# Patient Record
Sex: Female | Born: 2000 | Race: White | Hispanic: No | Marital: Single | Smoking: Former smoker
Health system: Southern US, Community
[De-identification: ages and names within clinical notes are randomized; demographics above are authoritative.]

## PROBLEM LIST (undated history)

## (undated) DIAGNOSIS — E119 Type 2 diabetes mellitus without complications: Secondary | ICD-10-CM

## (undated) DIAGNOSIS — K760 Fatty (change of) liver, not elsewhere classified: Secondary | ICD-10-CM

## (undated) DIAGNOSIS — E282 Polycystic ovarian syndrome: Secondary | ICD-10-CM

## (undated) DIAGNOSIS — J45909 Unspecified asthma, uncomplicated: Secondary | ICD-10-CM

## (undated) DIAGNOSIS — I10 Essential (primary) hypertension: Secondary | ICD-10-CM

## (undated) HISTORY — PX: WISDOM TOOTH EXTRACTION: SHX21

---
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
## 2019-02-19 ENCOUNTER — Other Ambulatory Visit: Payer: Self-pay

## 2019-02-19 ENCOUNTER — Emergency Department (HOSPITAL_COMMUNITY)
Admission: EM | Admit: 2019-02-19 | Discharge: 2019-02-19 | Disposition: A | Discharge status: Home / Self Care | Payer: Medicaid Other | Attending: Emergency Medicine | Admitting: Emergency Medicine

## 2019-02-19 ENCOUNTER — Encounter (HOSPITAL_COMMUNITY): Payer: Self-pay | Admitting: Emergency Medicine

## 2019-02-19 ENCOUNTER — Emergency Department (HOSPITAL_COMMUNITY): Payer: Medicaid Other

## 2019-02-19 DIAGNOSIS — F17220 Nicotine dependence, chewing tobacco, uncomplicated: Secondary | ICD-10-CM | POA: Insufficient documentation

## 2019-02-19 DIAGNOSIS — R109 Unspecified abdominal pain: Secondary | ICD-10-CM | POA: Diagnosis not present

## 2019-02-19 DIAGNOSIS — J45909 Unspecified asthma, uncomplicated: Secondary | ICD-10-CM | POA: Insufficient documentation

## 2019-02-19 DIAGNOSIS — N3090 Cystitis, unspecified without hematuria: Secondary | ICD-10-CM

## 2019-02-19 LAB — URINALYSIS, ROUTINE W REFLEX MICROSCOPIC
Bilirubin Urine: NEGATIVE
Glucose, UA: NEGATIVE mg/dL
Ketones, ur: 20 mg/dL — AB
Nitrite: NEGATIVE
Protein, ur: NEGATIVE mg/dL
Specific Gravity, Urine: 1.013 (ref 1.005–1.030)
pH: 7 (ref 5.0–8.0)

## 2019-02-19 LAB — PREGNANCY, URINE: Preg Test, Ur: NEGATIVE

## 2019-02-19 MED ORDER — CEPHALEXIN 500 MG PO CAPS: 500.0000 mg | ORAL_CAPSULE | Freq: Four times a day (QID) | ORAL | 0 refills | Status: DC

## 2019-02-19 NOTE — ED Triage Notes (Signed)
Pt states she has had right side flank pain for a couple days now, taking ibuprofen for pain without any relief. Pt reports hx of polycystic ovarian, fatty liver, and previous kidney infections. Pt doesn't think she's had a fever.

## 2019-02-19 NOTE — ED Provider Notes (Signed)
[facility] EMERGENCY DEPARTMENT Provider Note   CSN: [id number] Arrival date & time: 02/19/19  1308     History   Chief Complaint Chief Complaint  Patient presents with   Flank Pain    HPI Katelyn Adkins is a 18 y.o. female.     HPI  Pt was seen at 1525. Per pt, c/o gradual onset and persistence of constant right sided low back "pain" for the past several days. Pain worsens with palpation of the area and body position changes. Denies hematuria/dysuria, no abd pain, no N/V/D, no fevers, no CP/SOB, no cough, no incont/retention of bowel or bladder, no saddle anesthesia, no focal motor weakness, no tingling/numbness in extremities, no injury.   The symptoms have been associated with no other complaints.     Past Medical History:  Diagnosis Date   Asthma    Fatty liver    Polycystic ovarian disease     There are no active problems to display for this patient.   Past Surgical History:  Procedure Laterality Date   WISDOM TOOTH EXTRACTION       OB History   No obstetric history on file.      Home Medications    Prior to Admission medications   Not on File    Family History No family history on file.  Social History Social History   Tobacco Use   Smoking status: Former Smoker   Smokeless tobacco: Current User    Types: Chew  Substance Use Topics   Alcohol use: No   Drug use: No     Allergies   Tylenol [acetaminophen]   Review of Systems Review of Systems ROS: Statement: All systems negative except as marked or noted in the HPI; Constitutional: Negative for fever and chills. ; ; Eyes: Negative for eye pain, redness and discharge. ; ; ENMT: Negative for ear pain, hoarseness, nasal congestion, sinus pressure and sore throat. ; ; Cardiovascular: Negative for chest pain, palpitations, diaphoresis, dyspnea and peripheral edema. ; ; Respiratory: Negative for cough, wheezing and stridor. ; ; Gastrointestinal: Negative for nausea, vomiting,  diarrhea, abdominal pain, blood in stool, hematemesis, jaundice and rectal bleeding. . ; ; Genitourinary: Negative for dysuria and hematuria. ; ; Musculoskeletal: +LBP. Negative for neck pain. Negative for swelling and trauma.; ; GYN:  No pelvic pain, no vaginal bleeding, no vaginal discharge, no vulvar pain. ;;  Skin: Negative for pruritus, rash, abrasions, blisters, bruising and skin lesion.; ; Neuro: Negative for headache, lightheadedness and neck stiffness. Negative for weakness, altered level of consciousness, altered mental status, extremity weakness, paresthesias, involuntary movement, seizure and syncope.       Physical Exam Updated Vital Signs BP (!) 152/98 (BP Location: Right Arm)    Pulse 79    Temp 98.1 F (36.7 C) (Oral)    Resp 16    Ht 5\' 5"  (1.651 m)    LMP  (LMP Unknown)    SpO2 99%    BMI 56.58 kg/m   Physical Exam 1530: Physical examination:  Nursing notes reviewed; Vital signs and O2 SAT reviewed;  Constitutional: Well developed, Well nourished, Well hydrated, In no acute distress; Head:  Normocephalic, atraumatic; Eyes: EOMI, PERRL, No scleral icterus; ENMT: Mouth and pharynx normal, Mucous membranes moist; Neck: Supple, Full range of motion, No lymphadenopathy; Cardiovascular: Regular rate and rhythm, No gallop; Respiratory: Breath sounds clear & equal bilaterally, No wheezes.  Speaking full sentences with ease, Normal respiratory effort/excursion; Chest: Nontender, Movement normal; Abdomen: Soft, Nontender, Nondistended, Normal bowel  sounds; Genitourinary: No CVA tenderness; Spine:  No midline CS, TS, LS tenderness. +TTP right lumbar paraspinal muscles.;; Extremities: Peripheral pulses normal, No tenderness, No edema, No calf edema or asymmetry.; Neuro: AA&Ox3, Major CN grossly intact.  Speech clear. No gross focal motor or sensory deficits in extremities.; Skin: Color normal, Warm, Dry.   ED Treatments / Results  Labs (all labs ordered are listed, but only abnormal results  are displayed)   EKG None  Radiology   Procedures Procedures (including critical care time)  Medications Ordered in ED Medications - No data to display   Initial Impression / Assessment and Plan / ED Course  I have reviewed the triage vital signs and the nursing notes.  Pertinent labs & imaging results that were available during my care of the patient were reviewed by me and considered in my medical decision making (see chart for details).     MDM Reviewed: previous chart, nursing note and vitals Reviewed previous: labs Interpretation: labs and CT scan    Results for orders placed or performed during the hospital encounter of 02/19/19  Urinalysis, Routine w reflex microscopic  Result Value Ref Range   Color, Urine YELLOW YELLOW   APPearance HAZY (A) CLEAR   Specific Gravity, Urine 1.013 1.005 - 1.030   pH 7.0 5.0 - 8.0   Glucose, UA NEGATIVE NEGATIVE mg/dL   Hgb urine dipstick SMALL (A) NEGATIVE   Bilirubin Urine NEGATIVE NEGATIVE   Ketones, ur 20 (A) NEGATIVE mg/dL   Protein, ur NEGATIVE NEGATIVE mg/dL   Nitrite NEGATIVE NEGATIVE   Leukocytes,Ua MODERATE (A) NEGATIVE   RBC / HPF 0-5 0 - 5 RBC/hpf   WBC, UA 6-10 0 - 5 WBC/hpf   Bacteria, UA RARE (A) NONE SEEN   Squamous Epithelial / LPF 6-10 0 - 5  Pregnancy, urine  Result Value Ref Range   Preg Test, Ur NEGATIVE NEGATIVE   Ct Renal Stone Study Result Date: 02/19/2019 CLINICAL DATA:  Right flank pain. History kidney infections. EXAM: CT ABDOMEN AND PELVIS WITHOUT CONTRAST TECHNIQUE: Multidetector CT imaging of the abdomen and pelvis was performed following the standard protocol without IV contrast. COMPARISON:  02/05/2017 FINDINGS: Lower chest: Clear lung bases. Normal heart size without pericardial or pleural effusion. Hepatobiliary: Hepatic steatosis and hepatomegaly. 22.2 cm. Normal gallbladder, without biliary ductal dilatation. Pancreas: Normal, without mass or ductal dilatation. Spleen: Normal in size,  without focal abnormality. Adrenals/Urinary Tract: Normal adrenal glands. No renal calculi or hydronephrosis. No hydroureter or ureteric calculi. No bladder calculi. Stomach/Bowel: Normal stomach, without wall thickening. Colonic stool burden suggests constipation. Appendix not visualized. No right lower quadrant inflammation. Normal small bowel. Vascular/Lymphatic: Normal caliber of the aorta and branch vessels. No abdominal adenopathy. Prominent bilateral inguinal nodes are likely reactive. Reproductive: Normal uterus and adnexa. Other: No significant free fluid. Musculoskeletal: No acute osseous abnormality. Mild degradation secondary to patient body habitus. IMPRESSION: 1. No urinary tract calculi or hydronephrosis. 2. Possible constipation. 3. Hepatic steatosis and hepatomegaly. Electronically Signed   By: Abigail Miyamoto M.D.   On: 02/19/2019 17:04    Katelyn Adkins was evaluated in Emergency Department on 02/19/2019 for the symptoms described in the history of present illness. She was evaluated in the context of the global COVID-19 pandemic, which necessitated consideration that the patient might be at risk for infection with the SARS-CoV-2 virus that causes COVID-19. Institutional protocols and algorithms that pertain to the evaluation of patients at risk for COVID-19 are in a state of rapid change based  on information released by regulatory bodies including the CDC and federal and state organizations. These policies and algorithms were followed during the patient's care in the ED.   1725:  Workup reassuring, no clear UTI on Udip. Pt however now states she "might have a little burning" when she urinates; will tx with abx. Dx and testing, as well as incidental finding(s), d/w pt and family.  Questions answered.  Verb understanding, agreeable to d/c home with outpt f/u.    Final Clinical Impressions(s) / ED Diagnoses   Final diagnoses:  None    ED Discharge Orders    None       Samuel Jester, DO 02/23/19 2322

## 2019-02-19 NOTE — Discharge Instructions (Signed)
Take the prescription as directed.  Apply moist heat or ice to the area(s) of discomfort, for 15 minutes at a time, several times per day for the next few days.  Do not fall asleep on a heating or ice pack.  Call your regular medical doctor on Monday to schedule a follow up appointment this week.  Return to the Emergency Department immediately if worsening. ° °

## 2019-03-08 ENCOUNTER — Other Ambulatory Visit: Payer: Self-pay | Admitting: Orthopedic Surgery

## 2019-03-08 ENCOUNTER — Other Ambulatory Visit: Payer: Self-pay

## 2019-03-08 ENCOUNTER — Ambulatory Visit
Admission: RE | Admit: 2019-03-08 | Discharge: 2019-03-08 | Disposition: A | Discharge status: Home / Self Care | Payer: Medicaid Other | Source: Ambulatory Visit | Attending: Orthopedic Surgery | Admitting: Orthopedic Surgery

## 2019-03-08 DIAGNOSIS — S5001XA Contusion of right elbow, initial encounter: Secondary | ICD-10-CM

## 2019-03-08 DIAGNOSIS — S5011XA Contusion of right forearm, initial encounter: Secondary | ICD-10-CM

## 2019-05-17 ENCOUNTER — Other Ambulatory Visit: Payer: Medicaid Other

## 2019-12-17 ENCOUNTER — Other Ambulatory Visit: Payer: Self-pay

## 2019-12-17 ENCOUNTER — Encounter: Payer: Self-pay | Admitting: Emergency Medicine

## 2019-12-17 ENCOUNTER — Ambulatory Visit
Admission: EM | Admit: 2019-12-17 | Discharge: 2019-12-17 | Disposition: A | Discharge status: Home / Self Care | Payer: Medicaid Other | Attending: Emergency Medicine | Admitting: Emergency Medicine

## 2019-12-17 DIAGNOSIS — S40861A Insect bite (nonvenomous) of right upper arm, initial encounter: Secondary | ICD-10-CM

## 2019-12-17 DIAGNOSIS — W57XXXA Bitten or stung by nonvenomous insect and other nonvenomous arthropods, initial encounter: Secondary | ICD-10-CM

## 2019-12-17 DIAGNOSIS — L299 Pruritus, unspecified: Secondary | ICD-10-CM

## 2019-12-17 MED ORDER — PREDNISONE 20 MG PO TABS: 20.0000 mg | ORAL_TABLET | Freq: Two times a day (BID) | ORAL | 0 refills | Status: AC

## 2019-12-17 MED ORDER — HYDROXYZINE HCL 25 MG PO TABS: 25.0000 mg | ORAL_TABLET | Freq: Four times a day (QID) | ORAL | 0 refills | Status: DC

## 2019-12-17 NOTE — ED Triage Notes (Addendum)
Pt reports itching all over body since last night. Pt does have some red bumps on her arms, legs and under chin.  Pt states she has been itching in places that dont have the bumps as well.  Took benadryl at 0500 today and states it did help some with the itching

## 2019-12-17 NOTE — Discharge Instructions (Signed)
Wash with warm water and mild soap Prednisone prescribed.  Take as directed and to completion Use OTC zyrtec, allegra, or claritin during the day.  Benadryl at night. You may also use OTC hydrocortisone cream and/or calamine lotion to help alleviate itching Hydroxyzine for severe itching.  This may make you drowsy.  Do not take prior to driving Follow up with PCP if symptoms persists  Return or go to the ED if you have any new or worsening symptoms such as fever, chills, nausea, vomiting, difficulty breathing, throat swelling, tongue swelling, numbness/ tingling in mouth, worsening symptoms despite treatment, etc..Marland Kitchen

## 2019-12-17 NOTE — ED Provider Notes (Addendum)
[facility] CARE CENTER   [id number] 12/17/19 Arrival Time: 5366  CC: Insect bites and itching  SUBJECTIVE:  Katelyn Adkins is a 19 y.o. female who presents with insect bites and itching x 1-2 days.  Symptoms began after fishing, speculates she may have had a bug bite her RT arm and underneath her chin.  Denies changes in soaps, detergents, close contacts with similar rash, known trigger or environmental trigger, allergy. Denies medications change or starting a new medication recently.  Localizes the bites to RT arm and underneath chin.  Describes it as itchy and red.  Has tried benadryl without relief.  Symptoms are made worse with scratching.  Denies similar symptoms in the past.   Denies fever, chills, nausea, vomiting, discharge, oral manifestations, SOB, chest pain, abdominal pain, changes in bowel or bladder function.    ROS: As per HPI.  All other pertinent ROS negative.     Past Medical History:  Diagnosis Date  . Asthma   . Fatty liver   . Polycystic ovarian disease    Past Surgical History:  Procedure Laterality Date  . WISDOM TOOTH EXTRACTION     Allergies  Allergen Reactions  . Penicillins     Not sure pt report she was allergic when she was little   . Tylenol [Acetaminophen] Nausea And Vomiting   No current facility-administered medications on file prior to encounter.   No current outpatient medications on file prior to encounter.   Social History   Socioeconomic History  . Marital status: Single    Spouse name: Not on file  . Number of children: Not on file  . Years of education: Not on file  . Highest education level: Not on file  Occupational History  . Not on file  Tobacco Use  . Smoking status: Former Games developer  . Smokeless tobacco: Current User    Types: Chew  Substance and Sexual Activity  . Alcohol use: No  . Drug use: No  . Sexual activity: Not on file  Other Topics Concern  . Not on file  Social History Narrative  . Not on file   Social  Determinants of Health   Financial Resource Strain:   . Difficulty of Paying Living Expenses:   Food Insecurity:   . Worried About Programme researcher, broadcasting/film/video in the Last Year:   . Barista in the Last Year:   Transportation Needs:   . Freight forwarder (Medical):   Marland Kitchen Lack of Transportation (Non-Medical):   Physical Activity:   . Days of Exercise per Week:   . Minutes of Exercise per Session:   Stress:   . Feeling of Stress :   Social Connections:   . Frequency of Communication with Friends and Family:   . Frequency of Social Gatherings with Friends and Family:   . Attends Religious Services:   . Active Member of Clubs or Organizations:   . Attends Banker Meetings:   Marland Kitchen Marital Status:   Intimate Partner Violence:   . Fear of Current or Ex-Partner:   . Emotionally Abused:   Marland Kitchen Physically Abused:   . Sexually Abused:    No family history on file.  OBJECTIVE: Vitals:   12/17/19 0904 12/17/19 0907  BP:  (!) 152/95  Pulse:  94  Resp:  20  Temp:  98.2 F (36.8 C)  TempSrc:  Oral  SpO2:  93%  Weight: (!) 425 lb (192.8 kg)   Height: 5\' 6"  (1.676 m)  General appearance: alert; no distress Head: NCAT Lungs: normal respiratory effort Extremities: no edema Skin: warm and dry; erythematous papules with scab formation to RT arm and underneath chin, no surrounding erythema, drainage or bleeding; NTTP Psychological: alert and cooperative; normal mood and affect  ASSESSMENT & PLAN:  1. Insect bite of right upper extremity, initial encounter   2. Itching     Meds ordered this encounter  Medications  . hydrOXYzine (ATARAX/VISTARIL) 25 MG tablet    Sig: Take 1 tablet (25 mg total) by mouth every 6 (six) hours.    Dispense:  12 tablet    Refill:  0    Order Specific Question:   Supervising Provider    Answer:   Eustace Moore [[id number]]  . predniSONE (DELTASONE) 20 MG tablet    Sig: Take 1 tablet (20 mg total) by mouth 2 (two) times daily with a meal  for 5 days.    Dispense:  10 tablet    Refill:  0    Order Specific Question:   Supervising Provider    Answer:   Eustace Moore [[id number]]   Wash with warm water and mild soap Prednisone prescribed.  Take as directed and to completion Use OTC zyrtec, allegra, or claritin during the day.  Benadryl at night. You may also use OTC hydrocortisone cream and/or calamine lotion to help alleviate itching Hydroxyzine for severe itching.  This may make you drowsy.  Do not take prior to driving Follow up with PCP if symptoms persists  Return or go to the ED if you have any new or worsening symptoms such as fever, chills, nausea, vomiting, difficulty breathing, throat swelling, tongue swelling, numbness/ tingling in mouth, worsening symptoms despite treatment, etc...   Reviewed expectations re: course of current medical issues. Questions answered. Outlined signs and symptoms indicating need for more acute intervention. Patient verbalized understanding. After Visit Summary given.   Rennis Harding, PA-C 12/17/19 0865    Rennis Harding, PA-C 12/17/19 [phone]

## 2020-01-27 ENCOUNTER — Emergency Department: Payer: Medicaid Other

## 2020-01-27 ENCOUNTER — Emergency Department
Admission: EM | Admit: 2020-01-27 | Discharge: 2020-01-27 | Disposition: A | Discharge status: Home / Self Care | Payer: Medicaid Other | Attending: Emergency Medicine | Admitting: Emergency Medicine

## 2020-01-27 ENCOUNTER — Encounter: Payer: Self-pay | Admitting: Emergency Medicine

## 2020-01-27 ENCOUNTER — Other Ambulatory Visit: Payer: Self-pay

## 2020-01-27 DIAGNOSIS — R079 Chest pain, unspecified: Secondary | ICD-10-CM | POA: Diagnosis present

## 2020-01-27 DIAGNOSIS — Z5321 Procedure and treatment not carried out due to patient leaving prior to being seen by health care provider: Secondary | ICD-10-CM | POA: Diagnosis not present

## 2020-01-27 DIAGNOSIS — J45909 Unspecified asthma, uncomplicated: Secondary | ICD-10-CM | POA: Insufficient documentation

## 2020-01-27 LAB — CBC
HCT: 39.9 % (ref 36.0–46.0)
Hemoglobin: 12.6 g/dL (ref 12.0–15.0)
MCH: 26.9 pg (ref 26.0–34.0)
MCHC: 31.6 g/dL (ref 30.0–36.0)
MCV: 85.1 fL (ref 80.0–100.0)
Platelets: 324 10*3/uL (ref 150–400)
RBC: 4.69 MIL/uL (ref 3.87–5.11)
RDW: 14.1 % (ref 11.5–15.5)
WBC: 13.2 10*3/uL — ABNORMAL HIGH (ref 4.0–10.5)
nRBC: 0 % (ref 0.0–0.2)

## 2020-01-27 LAB — URINALYSIS, COMPLETE (UACMP) WITH MICROSCOPIC
Bilirubin Urine: NEGATIVE
Glucose, UA: NEGATIVE mg/dL
Hgb urine dipstick: NEGATIVE
Ketones, ur: NEGATIVE mg/dL
Nitrite: NEGATIVE
Protein, ur: 30 mg/dL — AB
Specific Gravity, Urine: 1.03 (ref 1.005–1.030)
WBC, UA: >50 WBC/hpf — ABNORMAL HIGH (ref 0–5)
pH: 5 (ref 5.0–8.0)

## 2020-01-27 LAB — BASIC METABOLIC PANEL
Anion gap: 12 (ref 5–15)
BUN: 10 mg/dL (ref 6–20)
CO2: 26 mmol/L (ref 22–32)
Calcium: 9.1 mg/dL (ref 8.9–10.3)
Chloride: 101 mmol/L (ref 98–111)
Creatinine, Ser: 0.61 mg/dL (ref 0.44–1.00)
GFR calc Af Amer: >60 mL/min (ref >60)
GFR calc non Af Amer: >60 mL/min (ref >60)
Glucose, Bld: 131 mg/dL — ABNORMAL HIGH (ref 70–99)
Potassium: 3.5 mmol/L (ref 3.5–5.1)
Sodium: 139 mmol/L (ref 135–145)

## 2020-01-27 LAB — TROPONIN I (HIGH SENSITIVITY): Troponin I (High Sensitivity): 4 ng/L (ref <18)

## 2020-01-27 LAB — POCT PREGNANCY, URINE: Preg Test, Ur: NEGATIVE

## 2020-01-27 NOTE — ED Notes (Signed)
Patient up to stat desk to report she was leaving and going to urgent care in the morning. Patient encouraged to stay. Patient left.

## 2020-01-27 NOTE — ED Triage Notes (Signed)
Pt arrives ambulatory to triage with c/o chest pain. Pt states that she has had some asthma attacks for the last two days with associated chest pain. Pt's lung sounds are clear throughout all fields at this time in triage and pt is in NAD.

## 2020-01-28 ENCOUNTER — Ambulatory Visit
Admission: EM | Admit: 2020-01-28 | Discharge: 2020-01-28 | Disposition: A | Discharge status: Home / Self Care | Payer: Medicaid Other | Attending: Emergency Medicine | Admitting: Emergency Medicine

## 2020-01-28 ENCOUNTER — Other Ambulatory Visit: Payer: Self-pay

## 2020-01-28 DIAGNOSIS — R300 Dysuria: Secondary | ICD-10-CM

## 2020-01-28 DIAGNOSIS — J4521 Mild intermittent asthma with (acute) exacerbation: Secondary | ICD-10-CM | POA: Diagnosis not present

## 2020-01-28 MED ORDER — PREDNISONE 10 MG (21) PO TBPK: ORAL_TABLET | Freq: Every day | ORAL | 0 refills | Status: DC

## 2020-01-28 MED ORDER — NITROFURANTOIN MONOHYD MACRO 100 MG PO CAPS: 100.0000 mg | ORAL_CAPSULE | Freq: Two times a day (BID) | ORAL | 0 refills | Status: DC

## 2020-01-28 NOTE — ED Provider Notes (Signed)
[facility]   [id number] 01/28/20 Arrival Time: 1317   CC: ASTHMA; UTI symptoms  SUBJECTIVE: History from: patient.  SWETHA RAYLE is a 19 y.o. female who presents with complaint of intermittent chest pain, chest tightness and dyspnea. Triggers: None. Onset gradual, approximately 3 days ago. Describes wheezing as mild when present. Fever: no. Overall normal PO intake without n/v. Sick contacts: no.  Typically her asthma is well controlled. Denies fever, chills, vomiting, SOB, chest pain, abdominal pain, changes in bowel or bladder function.   Inhaler use: Tried with intermittent relief  Also reports urinary frequency, urgency, dysuria x 2 weeks.  Reports hx of PCOS and has recurrent UTIs.  Has tried AZO.  Worse with urination.  Complains of associated nausea.  Denies fever, vomiting, abdominal or back pain.    ROS: As per HPI.  All other pertinent ROS negative.    Past Medical History:  Diagnosis Date  . Asthma   . Fatty liver   . Polycystic ovarian disease    Past Surgical History:  Procedure Laterality Date  . WISDOM TOOTH EXTRACTION     Allergies  Allergen Reactions  . Penicillins     Not sure pt report she was allergic when she was little   . Tylenol [Acetaminophen] Nausea And Vomiting   No current facility-administered medications on file prior to encounter.   No current outpatient medications on file prior to encounter.   Social History   Socioeconomic History  . Marital status: Single    Spouse name: Not on file  . Number of children: Not on file  . Years of education: Not on file  . Highest education level: Not on file  Occupational History  . Not on file  Tobacco Use  . Smoking status: Former Games developer  . Smokeless tobacco: Current User    Types: Chew  Substance and Sexual Activity  . Alcohol use: No  . Drug use: No  . Sexual activity: Not on file  Other Topics Concern  . Not on file  Social History Narrative  . Not on file   Social  Determinants of Health   Financial Resource Strain:   . Difficulty of Paying Living Expenses: Not on file  Food Insecurity:   . Worried About Programme researcher, broadcasting/film/video in the Last Year: Not on file  . Ran Out of Food in the Last Year: Not on file  Transportation Needs:   . Lack of Transportation (Medical): Not on file  . Lack of Transportation (Non-Medical): Not on file  Physical Activity:   . Days of Exercise per Week: Not on file  . Minutes of Exercise per Session: Not on file  Stress:   . Feeling of Stress : Not on file  Social Connections:   . Frequency of Communication with Friends and Family: Not on file  . Frequency of Social Gatherings with Friends and Family: Not on file  . Attends Religious Services: Not on file  . Active Member of Clubs or Organizations: Not on file  . Attends Banker Meetings: Not on file  . Marital Status: Not on file  Intimate Partner Violence:   . Fear of Current or Ex-Partner: Not on file  . Emotionally Abused: Not on file  . Physically Abused: Not on file  . Sexually Abused: Not on file   No family history on file.  OBJECTIVE:  Vitals:   01/28/20 1412  BP: (!) 155/85  Pulse: 94  Resp: 17  Temp: 97.9 F (  36.6 C)  TempSrc: Tympanic  SpO2: 98%     General appearance: alert; well-appearing, nontoxic; speaking in full sentences and tolerating own secretions HEENT: NCAT; Ears: EACs clear, TMs pearly gray; Eyes: PERRL.  EOM grossly intact.Nose: nares patent without rhinorrhea, Throat: oropharynx clear, tonsils non erythematous or enlarged, uvula midline  Neck: supple without LAD Lungs: unlabored respirations, symmetrical air entry; cough: absent; no respiratory distress; CTAB Heart: regular rate and rhythm.  Skin: warm and dry Psychological: alert and cooperative; normal mood and affect   LABS:  Results for orders placed or performed during the hospital encounter of 01/27/20 (from the past 24 hour(s))  Urinalysis, Complete w  Microscopic     Status: Abnormal   Collection Time: 01/27/20  9:40 PM  Result Value Ref Range   Color, Urine AMBER (A) YELLOW   APPearance CLOUDY (A) CLEAR   Specific Gravity, Urine 1.030 1.005 - 1.030   pH 5.0 5.0 - 8.0   Glucose, UA NEGATIVE NEGATIVE mg/dL   Hgb urine dipstick NEGATIVE NEGATIVE   Bilirubin Urine NEGATIVE NEGATIVE   Ketones, ur NEGATIVE NEGATIVE mg/dL   Protein, ur 30 (A) NEGATIVE mg/dL   Nitrite NEGATIVE NEGATIVE   Leukocytes,Ua MODERATE (A) NEGATIVE   RBC / HPF 0-5 0 - 5 RBC/hpf   WBC, UA >50 (H) 0 - 5 WBC/hpf   Bacteria, UA RARE (A) NONE SEEN   Squamous Epithelial / LPF 11-20 0 - 5   Mucus PRESENT   Pregnancy, urine POC     Status: None   Collection Time: 01/27/20 10:18 PM  Result Value Ref Range   Preg Test, Ur NEGATIVE NEGATIVE  Basic metabolic panel     Status: Abnormal   Collection Time: 01/27/20 10:26 PM  Result Value Ref Range   Sodium 139 135 - 145 mmol/L   Potassium 3.5 3.5 - 5.1 mmol/L   Chloride 101 98 - 111 mmol/L   CO2 26 22 - 32 mmol/L   Glucose, Bld 131 (H) 70 - 99 mg/dL   BUN 10 6 - 20 mg/dL   Creatinine, Ser 6.33 0.44 - 1.00 mg/dL   Calcium 9.1 8.9 - 35.4 mg/dL   GFR calc non Af Amer >60 >60 mL/min   GFR calc Af Amer >60 >60 mL/min   Anion gap 12 5 - 15  CBC     Status: Abnormal   Collection Time: 01/27/20 10:26 PM  Result Value Ref Range   WBC 13.2 (H) 4.0 - 10.5 K/uL   RBC 4.69 3.87 - 5.11 MIL/uL   Hemoglobin 12.6 12.0 - 15.0 g/dL   HCT 56.2 36 - 46 %   MCV 85.1 80.0 - 100.0 fL   MCH 26.9 26.0 - 34.0 pg   MCHC 31.6 30.0 - 36.0 g/dL   RDW 56.3 89.3 - 73.4 %   Platelets 324 150 - 400 K/uL   nRBC 0.0 0.0 - 0.2 %  Troponin I (High Sensitivity)     Status: None   Collection Time: 01/27/20 10:26 PM  Result Value Ref Range   Troponin I (High Sensitivity) 4 <18 ng/L     EKG reviewed from 01/27/20  EKG sinus tachycardia without ST elevations, depressions, or prolonged PR interval.  No narrowing or widening of the QRS complexes.   Comparable to past EKG.   DIAGNOSTIC STUDIES:  DG Chest 2 View  Result Date: 01/27/2020 CLINICAL DATA:  Chest pain 12/28/2016 EXAM: CHEST - 2 VIEW COMPARISON:  None. FINDINGS: Lungs are well expanded, symmetric, and clear.  No pneumothorax or pleural effusion. Cardiac size within normal limits. Pulmonary vascularity is normal. Osseous structures are age-appropriate. No acute bone abnormality. IMPRESSION: No active cardiopulmonary disease. Electronically Signed   By: Helyn Numbers MD   On: 01/27/2020 22:25    X-rays negative for cardiopulmonary disease  I have reviewed the x-rays myself and the radiologist interpretation. I am in agreement with the radiologist interpretation.     ASSESSMENT & PLAN:  1. Mild intermittent asthma with acute exacerbation   2. Dysuria     Meds ordered this encounter  Medications  . predniSONE (STERAPRED UNI-PAK 21 TAB) 10 MG (21) TBPK tablet    Sig: Take by mouth daily. Take 6 tabs by mouth daily  for 2 days, then 5 tabs for 2 days, then 4 tabs for 2 days, then 3 tabs for 2 days, 2 tabs for 2 days, then 1 tab by mouth daily for 2 days    Dispense:  42 tablet    Refill:  0    Order Specific Question:   Supervising Provider    Answer:   Eustace Moore [[id number]]  . nitrofurantoin, macrocrystal-monohydrate, (MACROBID) 100 MG capsule    Sig: Take 1 capsule (100 mg total) by mouth 2 (two) times daily.    Dispense:  10 capsule    Refill:  0    Order Specific Question:   Supervising Provider    Answer:   Eustace Moore [[id number]]   Reviewed work-up from ED.  Everything appeared within normal limits.  Urine did show signs of infection.    Prednisone prescribed.  Take as directed and to completion Use inhaler as needed for SOB and wheezing Follow up with PCP next week Return here or go to ER if you have any new or worsening symptoms such as shortness of breath, difficulty breathing, accessory muscle use, rib retraction, if symptoms do not improve with  medication, chest pain, chest tightness, etc...  Urine analysis concerning for UTI Push fluids and get plenty of rest.   Take antibiotic as directed and to completion Follow up with PCP if symptoms persists Return here or go to ER if you have any new or worsening symptoms such as fever, worsening abdominal pain, nausea/vomiting, flank pain, etc..  Follow up with PCP this week for recheck  Reviewed expectations re: course of current medical issues. Questions answered. Outlined signs and symptoms indicating need for more acute intervention. Patient verbalized understanding. After Visit Summary given.          Rennis Harding, PA-C 01/28/20 1429

## 2020-01-28 NOTE — ED Triage Notes (Signed)
Pt triaged by provider  

## 2020-01-28 NOTE — Discharge Instructions (Addendum)
Reviewed work-up from ED.  Everything appeared within normal limits.  Urine did show signs of infection.    Prednisone prescribed.  Take as directed and to completion Use inhaler as needed for SOB and wheezing Follow up with PCP next week Return here or go to ER if you have any new or worsening symptoms such as shortness of breath, difficulty breathing, accessory muscle use, rib retraction, if symptoms do not improve with medication, chest pain, chest tightness, etc...  Urine analysis concerning for UTI Push fluids and get plenty of rest.   Take antibiotic as directed and to completion Follow up with PCP if symptoms persists Return here or go to ER if you have any new or worsening symptoms such as fever, worsening abdominal pain, nausea/vomiting, flank pain, etc..  Follow up with PCP this week for recheck

## 2020-03-07 ENCOUNTER — Ambulatory Visit
Admission: RE | Admit: 2020-03-07 | Discharge: 2020-03-07 | Disposition: A | Discharge status: Home / Self Care | Payer: Medicaid Other | Source: Ambulatory Visit | Attending: Emergency Medicine | Admitting: Emergency Medicine

## 2020-03-07 ENCOUNTER — Encounter: Payer: Medicaid Other | Admitting: Adult Health

## 2020-03-07 ENCOUNTER — Other Ambulatory Visit: Payer: Self-pay

## 2020-03-07 VITALS — BP 141/64 | HR 89 | Temp 97.9°F | Resp 16

## 2020-03-07 DIAGNOSIS — M5442 Lumbago with sciatica, left side: Secondary | ICD-10-CM

## 2020-03-07 MED ORDER — PREDNISONE 10 MG (21) PO TBPK: ORAL_TABLET | ORAL | 0 refills | Status: DC

## 2020-03-07 MED ORDER — CYCLOBENZAPRINE HCL 10 MG PO TABS: 10.0000 mg | ORAL_TABLET | Freq: Two times a day (BID) | ORAL | 0 refills | Status: AC | PRN

## 2020-03-07 NOTE — Discharge Instructions (Addendum)
Rest, ice and heat as needed Ensure adequate ROM as tolerated. Continue to take Tylenol as needed for pain Prescribed prednisone for inflammation Prescribed flexeril  for muscle spasm.  Do not drive or operate heavy machinery while taking this medication Return here or go to ER if you have any new or worsening symptoms such as numbness/tingling of the inner thighs, loss of bladder or bowel control, headache/blurry vision, nausea/vomiting, confusion/altered mental status, dizziness, weakness, passing out, imbalance, etc..Marland Kitchen

## 2020-03-07 NOTE — ED Triage Notes (Signed)
Pt presents with c/o back pain for 3-4 days, denies injury , lower back involved

## 2020-03-07 NOTE — ED Provider Notes (Signed)
[facility]   [id number] 03/07/20 Arrival Time: 1137   Chief Complaint  Patient presents with  . Back Pain     SUBJECTIVE: History from: patient.  CHRISIE JANKOVICH is a 19 y.o. female who presented to the urgent care with a complaint of back pain for the past 4 days.  Denies any precipitating event, trauma or injury.  She localizes the pain to the left low back.  Reports pain is radiating to the left leg..  She describes the pain as constant and achy.  She has tried OTC medications without relief.  Her symptoms are made worse with ROM.  She denies similar symptoms in the past.  Denies chills, fever, nausea, vomiting, diarrhea.   ROS: As per HPI.  All other pertinent ROS negative.     Past Medical History:  Diagnosis Date  . Asthma   . Fatty liver   . Polycystic ovarian disease    Past Surgical History:  Procedure Laterality Date  . WISDOM TOOTH EXTRACTION     Allergies  Allergen Reactions  . Penicillins     Not sure pt report she was allergic when she was little   . Tylenol [Acetaminophen] Nausea And Vomiting   No current facility-administered medications on file prior to encounter.   Current Outpatient Medications on File Prior to Encounter  Medication Sig Dispense Refill  . nitrofurantoin, macrocrystal-monohydrate, (MACROBID) 100 MG capsule Take 1 capsule (100 mg total) by mouth 2 (two) times daily. 10 capsule 0   Social History   Socioeconomic History  . Marital status: Single    Spouse name: Not on file  . Number of children: Not on file  . Years of education: Not on file  . Highest education level: Not on file  Occupational History  . Not on file  Tobacco Use  . Smoking status: Former Games developer  . Smokeless tobacco: Current User    Types: Chew  Substance and Sexual Activity  . Alcohol use: No  . Drug use: No  . Sexual activity: Not on file  Other Topics Concern  . Not on file  Social History Narrative  . Not on file   Social Determinants  of Health   Financial Resource Strain:   . Difficulty of Paying Living Expenses: Not on file  Food Insecurity:   . Worried About Programme researcher, broadcasting/film/video in the Last Year: Not on file  . Ran Out of Food in the Last Year: Not on file  Transportation Needs:   . Lack of Transportation (Medical): Not on file  . Lack of Transportation (Non-Medical): Not on file  Physical Activity:   . Days of Exercise per Week: Not on file  . Minutes of Exercise per Session: Not on file  Stress:   . Feeling of Stress : Not on file  Social Connections:   . Frequency of Communication with Friends and Family: Not on file  . Frequency of Social Gatherings with Friends and Family: Not on file  . Attends Religious Services: Not on file  . Active Member of Clubs or Organizations: Not on file  . Attends Banker Meetings: Not on file  . Marital Status: Not on file  Intimate Partner Violence:   . Fear of Current or Ex-Partner: Not on file  . Emotionally Abused: Not on file  . Physically Abused: Not on file  . Sexually Abused: Not on file   History reviewed. No pertinent family history.  OBJECTIVE:  Vitals:   03/07/20 1143  BP: (!) 141/64  Pulse: 89  Resp: 16  Temp: 97.9 F (36.6 C)  SpO2: 97%     Physical Exam Vitals and nursing note reviewed.  Constitutional:      General: She is not in acute distress.    Appearance: Normal appearance. She is normal weight. She is not ill-appearing, toxic-appearing or diaphoretic.  HENT:     Head: Normocephalic.  Cardiovascular:     Rate and Rhythm: Normal rate and regular rhythm.     Pulses: Normal pulses.     Heart sounds: Normal heart sounds. No murmur heard.  No friction rub. No gallop.   Pulmonary:     Effort: Pulmonary effort is normal. No respiratory distress.     Breath sounds: Normal breath sounds. No stridor. No wheezing, rhonchi or rales.  Chest:     Chest wall: No tenderness.  Musculoskeletal:        General: Tenderness present.      Lumbar back: Swelling and tenderness present.     Comments: Back:  Patient ambulates from chair to exam table without difficulty.  Inspection: Skin clear and intact without obvious swelling, erythema, or ecchymosis. Warm to the touch  Palpation: Vertebral processes nontender. Tenderness about the lower left paravertebral muscles  ROM: FROM Strength: 5/5 hip flexion, 5/5 knee extension, 5/5 knee flexion, 5/5 plantar flexion, 5/5 dorsiflexion  DTR: Patellar tendon reflex intact    Neurological:     Mental Status: She is alert and oriented to person, place, and time.     LABS:  No results found for this or any previous visit (from the past 24 hour(s)).   ASSESSMENT & PLAN:  1. Acute left-sided low back pain with left-sided sciatica     Meds ordered this encounter  Medications  . cyclobenzaprine (FLEXERIL) 10 MG tablet    Sig: Take 1 tablet (10 mg total) by mouth 2 (two) times daily as needed for muscle spasms.    Dispense:  20 tablet    Refill:  0  . predniSONE (STERAPRED UNI-PAK 21 TAB) 10 MG (21) TBPK tablet    Sig: Take 6 tabs by mouth daily  for 1 days, then 5 tabs for 1 days, then 4 tabs for 1 days, then 3 tabs for 1 days, 2 tabs for 1 days, then 1 tab by mouth daily for 1 days    Dispense:  21 tablet    Refill:  0    Discharge instructions  Rest, ice and heat as needed Ensure adequate ROM as tolerated. Continue to take Tylenol as needed for pain Prescribed prednisone for inflammation Prescribed flexeril  for muscle spasm.  Do not drive or operate heavy machinery while taking this medication Return here or go to ER if you have any new or worsening symptoms such as numbness/tingling of the inner thighs, loss of bladder or bowel control, headache/blurry vision, nausea/vomiting, confusion/altered mental status, dizziness, weakness, passing out, imbalance, etc...     Reviewed expectations re: course of current medical issues. Questions answered. Outlined signs and  symptoms indicating need for more acute intervention. Patient verbalized understanding. After Visit Summary given.         Durward Parcel, FNP 03/07/20 1219

## 2020-03-22 ENCOUNTER — Other Ambulatory Visit: Payer: Self-pay

## 2020-03-22 ENCOUNTER — Ambulatory Visit
Admission: RE | Admit: 2020-03-22 | Discharge: 2020-03-22 | Disposition: A | Discharge status: Home / Self Care | Payer: Medicaid Other | Source: Ambulatory Visit | Attending: Emergency Medicine | Admitting: Emergency Medicine

## 2020-03-22 VITALS — BP 145/97 | HR 103 | Temp 98.9°F | Resp 19 | Ht 65.0 in | Wt >= 6400 oz

## 2020-03-22 DIAGNOSIS — Z1152 Encounter for screening for COVID-19: Secondary | ICD-10-CM | POA: Diagnosis not present

## 2020-03-22 DIAGNOSIS — A084 Viral intestinal infection, unspecified: Secondary | ICD-10-CM

## 2020-03-22 HISTORY — DX: Essential (primary) hypertension: I10

## 2020-03-22 HISTORY — DX: Type 2 diabetes mellitus without complications: E11.9

## 2020-03-22 MED ORDER — ONDANSETRON 4 MG PO TBDP: 4.0000 mg | ORAL_TABLET | Freq: Three times a day (TID) | ORAL | 0 refills | Status: DC | PRN

## 2020-03-22 NOTE — ED Provider Notes (Addendum)
[facility]   [id number] 03/22/20 Arrival Time: 1656  CC: ABDOMINAL DISCOMFORT  SUBJECTIVE:  Katelyn Adkins is a 19 y.o. female who presented to the urgent care with a complaint of nausea, vomiting, diarrhea that started for the past 2 days.  Has 2 emesis in the last 24 hours.  Denies a precipitating event, trauma, close contacts with similar symptoms, recent travel or antibiotic use.  Localizes pain to generalized abdomen.  Describes it as constant and aching in character.  Has not tried any OTC medication.  Denies alleviating or aggravating factors.  Denies similar symptoms in the past.   Denies fever, chills, appetite changes, weight changes, nausea, vomiting hematochezia, melena, dysuria, difficulty urinating, increased frequency or urgency, flank pain, loss of bowel or bladder function, vaginal discharge, vaginal odor, vaginal bleeding, dyspareunia, pelvic pain.     No LMP recorded (lmp unknown). (Menstrual status: Irregular Periods).  ROS: As per HPI.  All other pertinent ROS negative.     Past Medical History:  Diagnosis Date  . Asthma   . Diabetes mellitus without complication (HCC)   . Fatty liver   . Hypertension   . Polycystic ovarian disease    Past Surgical History:  Procedure Laterality Date  . WISDOM TOOTH EXTRACTION     Allergies  Allergen Reactions  . Penicillins     Not sure pt report she was allergic when she was little   . Tylenol [Acetaminophen] Nausea And Vomiting   No current facility-administered medications on file prior to encounter.   Current Outpatient Medications on File Prior to Encounter  Medication Sig Dispense Refill  . cyclobenzaprine (FLEXERIL) 10 MG tablet Take 1 tablet (10 mg total) by mouth 2 (two) times daily as needed for muscle spasms. 20 tablet 0  . nitrofurantoin, macrocrystal-monohydrate, (MACROBID) 100 MG capsule Take 1 capsule (100 mg total) by mouth 2 (two) times daily. 10 capsule 0  . predniSONE (STERAPRED UNI-PAK 21  TAB) 10 MG (21) TBPK tablet Take 6 tabs by mouth daily  for 1 days, then 5 tabs for 1 days, then 4 tabs for 1 days, then 3 tabs for 1 days, 2 tabs for 1 days, then 1 tab by mouth daily for 1 days 21 tablet 0   Social History   Socioeconomic History  . Marital status: Single    Spouse name: Not on file  . Number of children: Not on file  . Years of education: Not on file  . Highest education level: Not on file  Occupational History  . Not on file  Tobacco Use  . Smoking status: Former Games developer  . Smokeless tobacco: Former Neurosurgeon    Types: Chew  Substance and Sexual Activity  . Alcohol use: No  . Drug use: No  . Sexual activity: Not on file  Other Topics Concern  . Not on file  Social History Narrative  . Not on file   Social Determinants of Health   Financial Resource Strain:   . Difficulty of Paying Living Expenses: Not on file  Food Insecurity:   . Worried About Programme researcher, broadcasting/film/video in the Last Year: Not on file  . Ran Out of Food in the Last Year: Not on file  Transportation Needs:   . Lack of Transportation (Medical): Not on file  . Lack of Transportation (Non-Medical): Not on file  Physical Activity:   . Days of Exercise per Week: Not on file  . Minutes of Exercise per Session: Not on file  Stress:   .  Feeling of Stress : Not on file  Social Connections:   . Frequency of Communication with Friends and Family: Not on file  . Frequency of Social Gatherings with Friends and Family: Not on file  . Attends Religious Services: Not on file  . Active Member of Clubs or Organizations: Not on file  . Attends Banker Meetings: Not on file  . Marital Status: Not on file  Intimate Partner Violence:   . Fear of Current or Ex-Partner: Not on file  . Emotionally Abused: Not on file  . Physically Abused: Not on file  . Sexually Abused: Not on file   No family history on file.   OBJECTIVE:  Vitals:   03/22/20 1706 03/22/20 1707  BP: (!) 145/97   Pulse: (!) 103     Resp: 19   Temp: 98.9 F (37.2 C)   TempSrc: Oral   SpO2: 95%   Weight:  (!) 410 lb (186 kg)  Height:  5\' 5"  (1.651 m)    General appearance: Alert; NAD HEENT: NCAT.  Oropharynx clear.  Lungs: clear to auscultation bilaterally without adventitious breath sounds Heart: regular rate and rhythm.  Radial pulses 2+ symmetrical bilaterally Abdomen: soft, non-distended; normal active bowel sounds; non-tender to light and deep palpation; nontender at McBurney's point; negative Murphy's sign; negative rebound; no guarding Back: no CVA tenderness Extremities: no edema; symmetrical with no gross deformities Skin: warm and dry Neurologic: normal gait Psychological: alert and cooperative; normal mood and affect  LABS: No results found for this or any previous visit (from the past 24 hour(s)).  DIAGNOSTIC STUDIES: No results found.   ASSESSMENT & PLAN:  1. Encounter for screening for COVID-19   2. Viral gastroenteritis     Meds ordered this encounter  Medications  . ondansetron (ZOFRAN ODT) 4 MG disintegrating tablet    Sig: Take 1 tablet (4 mg total) by mouth every 8 (eight) hours as needed for nausea or vomiting.    Dispense:  30 tablet    Refill:  0    Discharge instructions  Get rest and drink fluids Zofran prescribed.  Take as directed.   Use OTC Imodium as needed for diarrhea  DIET Instructions:  30 minutes after taking nausea medicine, begin with sips of clear liquids. If able to hold down 2 - 4 ounces for 30 minutes, begin drinking more. Increase your fluid intake to replace losses. Clear liquids only for 24 hours (water, tea, sport drinks, clear flat ginger ale or cola and juices, broth, jello, popsicles, ect). Advance to bland foods, applesauce, rice, baked or boiled chicken, ect. Avoid milk, greasy foods and anything that doesn't agree with you.  If you experience new or worsening symptoms return or go to ER such as fever, chills, nausea, vomiting, diarrhea, bloody  or dark tarry stools, constipation, urinary symptoms, worsening abdominal discomfort, symptoms that do not improve with medications, inability to keep fluids down, etc...  Reviewed expectations re: course of current medical issues. Questions answered. Outlined signs and symptoms indicating need for more acute intervention. Patient verbalized understanding. After Visit Summary given.   , FNP 03/22/20 1722    13/11/21, FNP 03/22/20 1724

## 2020-03-22 NOTE — Discharge Instructions (Addendum)
Get rest and drink fluids Zofran prescribed.  Take as directed.   Use OTC Imodium as needed for diarrhea DIET Instructions:  30 minutes after taking nausea medicine, begin with sips of clear liquids. If able to hold down 2 - 4 ounces for 30 minutes, begin drinking more. Increase your fluid intake to replace losses. Clear liquids only for 24 hours (water, tea, sport drinks, clear flat ginger ale or cola and juices, broth, jello, popsicles, ect). Advance to bland foods, applesauce, rice, baked or boiled chicken, ect. Avoid milk, greasy foods and anything that doesn't agree with you.  If you experience new or worsening symptoms return or go to ER such as fever, chills, nausea, vomiting, diarrhea, bloody or dark tarry stools, constipation, urinary symptoms, worsening abdominal discomfort, symptoms that do not improve with medications, inability to keep fluids down, etc..Marland Kitchen

## 2020-03-22 NOTE — ED Triage Notes (Addendum)
N/V/D since Tuesday morning. abd pain in the middle of her abd that started last night.   Has vomited x 2 in 24 hours and has had diarrhea after she tries to eat something

## 2020-03-24 LAB — SARS-COV-2, NAA 2 DAY TAT

## 2020-03-24 LAB — NOVEL CORONAVIRUS, NAA: SARS-CoV-2, NAA: NOT DETECTED

## 2020-05-04 ENCOUNTER — Encounter: Payer: Self-pay | Admitting: Emergency Medicine

## 2020-05-04 ENCOUNTER — Ambulatory Visit
Admission: EM | Admit: 2020-05-04 | Discharge: 2020-05-04 | Disposition: A | Discharge status: Home / Self Care | Payer: BLUE CROSS/BLUE SHIELD | Attending: Emergency Medicine | Admitting: Emergency Medicine

## 2020-05-04 ENCOUNTER — Other Ambulatory Visit: Payer: Self-pay

## 2020-05-04 DIAGNOSIS — R062 Wheezing: Secondary | ICD-10-CM

## 2020-05-04 DIAGNOSIS — R509 Fever, unspecified: Secondary | ICD-10-CM

## 2020-05-04 DIAGNOSIS — R197 Diarrhea, unspecified: Secondary | ICD-10-CM

## 2020-05-04 MED ORDER — ONDANSETRON 4 MG PO TBDP: 4.0000 mg | ORAL_TABLET | Freq: Three times a day (TID) | ORAL | 0 refills | Status: DC | PRN

## 2020-05-04 MED ORDER — PREDNISONE 20 MG PO TABS: 40.0000 mg | ORAL_TABLET | Freq: Every day | ORAL | 0 refills | Status: AC

## 2020-05-04 NOTE — ED Triage Notes (Signed)
Patient in office diarrhea, sob x4d , chills and abdominal pain   OTC: [facility]

## 2020-05-04 NOTE — Discharge Instructions (Signed)
Use of inhaler or nebulizer as needed for wheezing or shortness of breath.   Complete 5 days of prednisone.  Zofran every 8 hours as needed for nausea or vomiting.   Use of imodium as needed for diarrhea, per box instruct.  Small frequent sips of fluids- Pedialyte, Gatorade, water, broth- to maintain hydration.   Self isolate until covid results are back, unfortunately this may be next week before they result.  Will notify you by phone of any positive findings. Your negative results will be sent through your MyChart.     Please go to the ER for any worsening of symptoms.

## 2020-05-04 NOTE — ED Provider Notes (Signed)
[facility]    CSN: [id number] Arrival date & time: 05/04/20  1040      History   Chief Complaint No chief complaint on file.   HPI Katelyn Adkins is a 19 y.o. female.   Alyson Reedy presents with complaints of chills, wheezing, shortness of breath, diarrhea, upper abdominal pain, nausea, no vomiting although did dry heave. Decreased oral intake. No congestion or nasal drainage. Rare cough. No sore throat or pain. Did take ibuprofen early this morning and just purchased over the counter diarrheal medication today. No known ill contacts. No history of covid-19 and has not received vaccination. History of asthma and dm.      ROS per HPI, negative if not otherwise mentioned.       Past Medical History:  Diagnosis Date  . Asthma   . Diabetes mellitus without complication (HCC)   . Fatty liver   . Hypertension   . Polycystic ovarian disease     There are no problems to display for this patient.   Past Surgical History:  Procedure Laterality Date  . WISDOM TOOTH EXTRACTION      OB History   No obstetric history on file.      Home Medications    Prior to Admission medications   Medication Sig Start Date End Date Taking? Authorizing Provider  cyclobenzaprine (FLEXERIL) 10 MG tablet Take 1 tablet (10 mg total) by mouth 2 (two) times daily as needed for muscle spasms. 03/07/20   Avegno, Zachery Dakins, FNP  nitrofurantoin, macrocrystal-monohydrate, (MACROBID) 100 MG capsule Take 1 capsule (100 mg total) by mouth 2 (two) times daily. 01/28/20   Wurst, [place], PA-C  ondansetron (ZOFRAN-ODT) 4 MG disintegrating tablet Take 1 tablet (4 mg total) by mouth every 8 (eight) hours as needed for nausea or vomiting. 05/04/20   Georgetta Haber, NP  predniSONE (DELTASONE) 20 MG tablet Take 2 tablets (40 mg total) by mouth daily with breakfast for 5 days. 05/04/20 05/09/20  Georgetta Haber, NP    Family History Family History  Problem Relation Age of Onset  .  Diabetes Mother   . Healthy Father     Social History Social History   Tobacco Use  . Smoking status: Former Games developer  . Smokeless tobacco: Former Neurosurgeon    Types: Chew  Substance Use Topics  . Alcohol use: No  . Drug use: No     Allergies   Penicillins and Tylenol [acetaminophen]   Review of Systems Review of Systems   Physical Exam Triage Vital Signs ED Triage Vitals  Enc Vitals Group     BP 05/04/20 1144 137/79     Pulse Rate 05/04/20 1144 (!) 109     Resp 05/04/20 1144 (!) 23     Temp 05/04/20 1144 (!) 100.5 F (38.1 C)     Temp Source 05/04/20 1144 Oral     SpO2 05/04/20 1144 97 %     Weight 05/04/20 1148 (!) 415 lb (188.2 kg)     Height 05/04/20 1148 5\' 5"  (1.651 m)     Head Circumference --      Peak Flow --      Pain Score 05/04/20 1147 5     Pain Loc --      Pain Edu? --      Excl. in GC? --    No data found.  Updated Vital Signs BP 137/79 (BP Location: Left Wrist)   Pulse (!) 109   Temp (!) 100.5 F (  38.1 C) (Oral)   Resp (!) 23   Ht 5\' 5"  (1.651 m)   Wt (!) 415 lb (188.2 kg)   LMP 12/03/2019   SpO2 97%   BMI 69.06 kg/m   Visual Acuity Right Eye Distance:   Left Eye Distance:   Bilateral Distance:    Right Eye Near:   Left Eye Near:    Bilateral Near:     Physical Exam Constitutional:      General: She is not in acute distress.    Appearance: She is well-developed.  Cardiovascular:     Rate and Rhythm: Normal rate.  Pulmonary:     Effort: Pulmonary effort is normal. Tachypnea present.     Breath sounds: Wheezing present.  Abdominal:     Tenderness: There is no abdominal tenderness.     Comments: No gross abdominal tenderness on palpation   Skin:    General: Skin is warm and dry.  Neurological:     Mental Status: She is alert and oriented to person, place, and time.      UC Treatments / Results  Labs (all labs ordered are listed, but only abnormal results are displayed) Labs Reviewed  COVID-19, FLU A+B NAA     EKG   Radiology No results found.  Procedures Procedures (including critical care time)  Medications Ordered in UC Medications - No data to display  Initial Impression / Assessment and Plan / UC Course  I have reviewed the triage vital signs and the nursing notes.  Pertinent labs & imaging results that were available during my care of the patient were reviewed by me and considered in my medical decision making (see chart for details).     Non toxic in appearance. Febrile today. Wheezing, diarrhea. Concern for covid-19 with these symptoms. Covid testing pending and isolation instructions provided.  Prednisone provided to help with wheezing. Rehydration and gi symptom management discussed. Return precautions provided. Patient verbalized understanding and agreeable to plan.   Final Clinical Impressions(s) / UC Diagnoses   Final diagnoses:  Wheezing  Febrile illness  Diarrhea, unspecified type     Discharge Instructions     Use of inhaler or nebulizer as needed for wheezing or shortness of breath.   Complete 5 days of prednisone.  Zofran every 8 hours as needed for nausea or vomiting.   Use of imodium as needed for diarrhea, per box instruct.  Small frequent sips of fluids- Pedialyte, Gatorade, water, broth- to maintain hydration.   Self isolate until covid results are back, unfortunately this may be next week before they result.  Will notify you by phone of any positive findings. Your negative results will be sent through your MyChart.     Please go to the ER for any worsening of symptoms.     ED Prescriptions    Medication Sig Dispense Auth. Provider   ondansetron (ZOFRAN-ODT) 4 MG disintegrating tablet Take 1 tablet (4 mg total) by mouth every 8 (eight) hours as needed for nausea or vomiting. 12 tablet 12/05/2019 B, NP   predniSONE (DELTASONE) 20 MG tablet Take 2 tablets (40 mg total) by mouth daily with breakfast for 5 days. 10 tablet Linus Mako, NP      PDMP not reviewed this encounter.   Georgetta Haber, NP 05/04/20 1342

## 2020-05-07 LAB — COVID-19, FLU A+B NAA
Influenza A, NAA: NOT DETECTED
Influenza B, NAA: NOT DETECTED
SARS-CoV-2, NAA: NOT DETECTED

## 2020-06-18 ENCOUNTER — Ambulatory Visit
Admission: EM | Admit: 2020-06-18 | Discharge: 2020-06-18 | Disposition: A | Discharge status: Home / Self Care | Payer: Medicaid Other | Attending: Emergency Medicine | Admitting: Emergency Medicine

## 2020-06-18 DIAGNOSIS — Z1152 Encounter for screening for COVID-19: Secondary | ICD-10-CM

## 2020-06-18 DIAGNOSIS — J4521 Mild intermittent asthma with (acute) exacerbation: Secondary | ICD-10-CM | POA: Diagnosis not present

## 2020-06-18 DIAGNOSIS — J029 Acute pharyngitis, unspecified: Secondary | ICD-10-CM

## 2020-06-18 MED ORDER — BENZONATATE 100 MG PO CAPS: 100.0000 mg | ORAL_CAPSULE | Freq: Three times a day (TID) | ORAL | 0 refills | Status: DC

## 2020-06-18 MED ORDER — PREDNISONE 10 MG (21) PO TBPK: ORAL_TABLET | Freq: Every day | ORAL | 0 refills | Status: DC

## 2020-06-18 MED ORDER — AZITHROMYCIN 250 MG PO TABS: 250.0000 mg | ORAL_TABLET | Freq: Every day | ORAL | 0 refills | Status: DC

## 2020-06-18 NOTE — ED Triage Notes (Signed)
Patient presents to Urgent Care with complaints of cough and sore throat. She states cough started 2 weeks ago and sore throat since Friday. Treating symptoms with Mucinex and cough syrup.   Denies fever, abdominal pain, N/V, or diarrhea.

## 2020-06-18 NOTE — Discharge Instructions (Addendum)
COVID testing ordered.  It will take between 2-7 days for test results.  Someone will contact you regarding abnormal results.    Get plenty of rest and push fluids Tessalon Perles prescribed for cough Continue to use albuterol as prescribed and directed Prednisone be prescribed Azithromycin was prescribed Use medications daily for symptom relief Use OTC medications like ibuprofen or tylenol as needed fever or pain Call or go to the ED if you have any new or worsening symptoms such as fever, worsening cough, shortness of breath, chest tightness, chest pain, turning blue, changes in mental status, etc..Marland Kitchen

## 2020-06-18 NOTE — ED Provider Notes (Signed)
[facility]   [id number] 06/18/20 Arrival Time: 9937   CC: COVID symptoms  SUBJECTIVE: History from: patient.  Katelyn Adkins is a 20 y.o. female with history of asthma presented to the urgent care with a complaint of cough with yellowish-green sputum for the past 2 weeks and sore throat that started this past Friday.  Denies sick exposure to COVID, flu or strep.  Denies recent travel.  Has tried OTC medication without relief.  Denies alleviating or aggravating factors.  Denies previous symptoms in the past.   Denies fever, chills, fatigue, sinus pain, rhinorrhea, sore throat, SOB, wheezing, chest pain, nausea, changes in bowel or bladder habits.     ROS: As per HPI.  All other pertinent ROS negative.      Past Medical History:  Diagnosis Date  . Asthma   . Diabetes mellitus without complication (HCC)   . Fatty liver   . Hypertension   . Polycystic ovarian disease    Past Surgical History:  Procedure Laterality Date  . WISDOM TOOTH EXTRACTION     Allergies  Allergen Reactions  . Penicillins     Not sure pt report she was allergic when she was little   . Tylenol [Acetaminophen] Nausea And Vomiting   No current facility-administered medications on file prior to encounter.   Current Outpatient Medications on File Prior to Encounter  Medication Sig Dispense Refill  . cyclobenzaprine (FLEXERIL) 10 MG tablet Take 1 tablet (10 mg total) by mouth 2 (two) times daily as needed for muscle spasms. 20 tablet 0  . nitrofurantoin, macrocrystal-monohydrate, (MACROBID) 100 MG capsule Take 1 capsule (100 mg total) by mouth 2 (two) times daily. 10 capsule 0  . ondansetron (ZOFRAN-ODT) 4 MG disintegrating tablet Take 1 tablet (4 mg total) by mouth every 8 (eight) hours as needed for nausea or vomiting. 12 tablet 0   Social History   Socioeconomic History  . Marital status: Single    Spouse name: Not on file  . Number of children: Not on file  . Years of education: Not on file   . Highest education level: Not on file  Occupational History  . Not on file  Tobacco Use  . Smoking status: Former Games developer  . Smokeless tobacco: Former Neurosurgeon    Types: Chew  Substance and Sexual Activity  . Alcohol use: No  . Drug use: No  . Sexual activity: Not on file  Other Topics Concern  . Not on file  Social History Narrative  . Not on file   Social Determinants of Health   Financial Resource Strain: Not on file  Food Insecurity: Not on file  Transportation Needs: Not on file  Physical Activity: Not on file  Stress: Not on file  Social Connections: Not on file  Intimate Partner Violence: Not on file   Family History  Problem Relation Age of Onset  . Diabetes Mother   . Healthy Father     OBJECTIVE:  Vitals:   06/18/20 0948  BP: (!) 158/101  Pulse: 85  Resp: 18  Temp: 98 F (36.7 C)  TempSrc: Oral  SpO2: 96%     General appearance: alert; appears fatigued, but nontoxic; speaking in full sentences and tolerating own secretions HEENT: NCAT; Ears: EACs clear, TMs pearly gray; Eyes: PERRL.  EOM grossly intact. Sinuses: nontender; Nose: nares patent without rhinorrhea, Throat: oropharynx clear, tonsils non erythematous or enlarged, uvula midline  Neck: supple without LAD Lungs: unlabored respirations, symmetrical air entry; cough: moderate; no respiratory  distress; CTAB Heart: regular rate and rhythm.  Radial pulses 2+ symmetrical bilaterally Skin: warm and dry Psychological: alert and cooperative; normal mood and affect  LABS:  No results found for this or any previous visit (from the past 24 hour(s)).   ASSESSMENT & PLAN:  1. Encounter for screening for COVID-19   2. Sore throat   3. Mild intermittent asthma with exacerbation     Meds ordered this encounter  Medications  . azithromycin (ZITHROMAX) 250 MG tablet    Sig: Take 1 tablet (250 mg total) by mouth daily. Take first 2 tablets together, then 1 every day until finished.    Dispense:  6  tablet    Refill:  0  . benzonatate (TESSALON) 100 MG capsule    Sig: Take 1 capsule (100 mg total) by mouth every 8 (eight) hours.    Dispense:  21 capsule    Refill:  0  . predniSONE (STERAPRED UNI-PAK 21 TAB) 10 MG (21) TBPK tablet    Sig: Take by mouth daily. Take 6 tabs by mouth daily  for 1days, then 5 tabs for 1 days, then 4 tabs for 1 days, then 3 tabs for 1 days, 2 tabs for 1 days, then 1 tab by mouth daily for 1 days    Dispense:  21 tablet    Refill:  0   Patient stable at discharge.  COVID-19 test was completed for rule out  as patient developed new onset of sore throat  Discharge instructions    COVID testing ordered.  It will take between 2-7 days for test results.  Someone will contact you regarding abnormal results.    Get plenty of rest and push fluids Tessalon Perles prescribed for cough Continue to use albuterol as prescribed and directed Prednisone be prescribed Azithromycin was prescribed Use medications daily for symptom relief Use OTC medications like ibuprofen or tylenol as needed fever or pain Call or go to the ED if you have any new or worsening symptoms such as fever, worsening cough, shortness of breath, chest tightness, chest pain, turning blue, changes in mental status, etc...   Reviewed expectations re: course of current medical issues. Questions answered. Outlined signs and symptoms indicating need for more acute intervention. Patient verbalized understanding. After Visit Summary given.         Durward Parcel, FNP 06/18/20 [phone]

## 2020-06-19 LAB — SARS-COV-2, NAA 2 DAY TAT

## 2020-06-19 LAB — NOVEL CORONAVIRUS, NAA: SARS-CoV-2, NAA: NOT DETECTED

## 2020-09-17 ENCOUNTER — Ambulatory Visit
Admission: EM | Admit: 2020-09-17 | Discharge: 2020-09-17 | Disposition: A | Discharge status: Home / Self Care | Payer: Medicaid Other | Attending: Family Medicine | Admitting: Family Medicine

## 2020-09-17 ENCOUNTER — Encounter: Payer: Self-pay | Admitting: Emergency Medicine

## 2020-09-17 ENCOUNTER — Other Ambulatory Visit: Payer: Self-pay

## 2020-09-17 DIAGNOSIS — R062 Wheezing: Secondary | ICD-10-CM

## 2020-09-17 DIAGNOSIS — J069 Acute upper respiratory infection, unspecified: Secondary | ICD-10-CM

## 2020-09-17 MED ORDER — PREDNISONE 10 MG (21) PO TBPK: ORAL_TABLET | Freq: Every day | ORAL | 0 refills | Status: AC

## 2020-09-17 MED ORDER — DOXYCYCLINE HYCLATE 100 MG PO CAPS: 100.0000 mg | ORAL_CAPSULE | Freq: Two times a day (BID) | ORAL | 0 refills | Status: DC

## 2020-09-17 NOTE — ED Provider Notes (Signed)
[facility]    CSN: [id number] Arrival date & time: 09/17/20  1802      History   Chief Complaint Chief Complaint  Patient presents with  . Sore Throat    HPI Katelyn Adkins is a 20 y.o. female.   Reports cough, congestion, headache, wheezing for the last 3 weeks. Reports that she thought that it just started as allergies and has progressed since then. Has been using albuterol nebulizer with some temporary relief. Denies sick contacts. Has positive hx Covid 05/2020. Has completed Covid vaccines. Has not completed flu vaccine. Has used alka seltzer cough and cold with little relief. Denies body aches, chills, fever, abdominal pain, nausea, vomiting, diarrhea, rash, other symptoms.  ROS per HPI  The history is provided by the patient.  Sore Throat    Past Medical History:  Diagnosis Date  . Asthma   . Diabetes mellitus without complication (HCC)   . Fatty liver   . Hypertension   . Polycystic ovarian disease     There are no problems to display for this patient.   Past Surgical History:  Procedure Laterality Date  . WISDOM TOOTH EXTRACTION      OB History   No obstetric history on file.      Home Medications    Prior to Admission medications   Medication Sig Start Date End Date Taking? Authorizing Provider  doxycycline (VIBRAMYCIN) 100 MG capsule Take 1 capsule (100 mg total) by mouth 2 (two) times daily. 09/17/20  Yes Moshe Cipro, NP  predniSONE (STERAPRED UNI-PAK 21 TAB) 10 MG (21) TBPK tablet Take by mouth daily for 6 days. Take 6 tablets on day 1, 5 tablets on day 2, 4 tablets on day 3, 3 tablets on day 4, 2 tablets on day 5, 1 tablet on day 6 09/17/20 09/23/20 Yes Moshe Cipro, NP  cyclobenzaprine (FLEXERIL) 10 MG tablet Take 1 tablet (10 mg total) by mouth 2 (two) times daily as needed for muscle spasms. 03/07/20   Avegno, Zachery Dakins, FNP  ondansetron (ZOFRAN-ODT) 4 MG disintegrating tablet Take 1 tablet (4 mg total) by mouth every 8  (eight) hours as needed for nausea or vomiting. 05/04/20   Georgetta Haber, NP    Family History Family History  Problem Relation Age of Onset  . Diabetes Mother   . Healthy Father     Social History Social History   Tobacco Use  . Smoking status: Former Games developer  . Smokeless tobacco: Former Neurosurgeon    Types: Chew  Substance Use Topics  . Alcohol use: No  . Drug use: No     Allergies   Penicillins and Tylenol [acetaminophen]   Review of Systems Review of Systems   Physical Exam Triage Vital Signs ED Triage Vitals  Enc Vitals Group     BP 09/17/20 1841 (!) 158/95     Pulse Rate 09/17/20 1841 (!) 105     Resp 09/17/20 1841 20     Temp 09/17/20 1841 98.5 F (36.9 C)     Temp Source 09/17/20 1841 Oral     SpO2 09/17/20 1841 95 %     Weight --      Height --      Head Circumference --      Peak Flow --      Pain Score 09/17/20 1838 5     Pain Loc --      Pain Edu? --      Excl. in GC? --  No data found.  Updated Vital Signs BP (!) 158/95 (BP Location: Right Wrist)   Pulse (!) 105   Temp 98.5 F (36.9 C) (Oral)   Resp 20   SpO2 95%       Physical Exam Vitals and nursing note reviewed.  Constitutional:      General: She is not in acute distress.    Appearance: She is well-developed. She is obese. She is ill-appearing.  HENT:     Head: Normocephalic and atraumatic.     Right Ear: Tympanic membrane, ear canal and external ear normal.     Left Ear: Tympanic membrane, ear canal and external ear normal.     Nose: Congestion present.     Mouth/Throat:     Mouth: Mucous membranes are moist.     Pharynx: Posterior oropharyngeal erythema present.     Comments: cobblestoning Eyes:     Extraocular Movements: Extraocular movements intact.     Conjunctiva/sclera: Conjunctivae normal.     Pupils: Pupils are equal, round, and reactive to light.  Cardiovascular:     Rate and Rhythm: Normal rate and regular rhythm.     Heart sounds: Normal heart sounds. No  murmur heard.   Pulmonary:     Effort: Pulmonary effort is normal. No respiratory distress.     Breath sounds: No stridor. Wheezing (diffuse bilaterally) present. No rhonchi or rales.  Chest:     Chest wall: No tenderness.  Abdominal:     Palpations: Abdomen is soft.     Tenderness: There is no abdominal tenderness.  Musculoskeletal:        General: Normal range of motion.     Cervical back: Normal range of motion and neck supple.  Lymphadenopathy:     Cervical: Cervical adenopathy present.  Skin:    General: Skin is warm and dry.     Capillary Refill: Capillary refill takes less than 2 seconds.  Neurological:     General: No focal deficit present.     Mental Status: She is alert and oriented to person, place, and time.  Psychiatric:        Mood and Affect: Mood normal.        Behavior: Behavior normal.        Thought Content: Thought content normal.      UC Treatments / Results  Labs (all labs ordered are listed, but only abnormal results are displayed) Labs Reviewed - No data to display  EKG   Radiology No results found.  Procedures Procedures (including critical care time)  Medications Ordered in UC Medications - No data to display  Initial Impression / Assessment and Plan / UC Course  I have reviewed the triage vital signs and the nursing notes.  Pertinent labs & imaging results that were available during my care of the patient were reviewed by me and considered in my medical decision making (see chart for details).    URI with cough and congestion Wheezing  Prescribed doxycycline 100mg  BID x 7 days Prescribed steroid taper Continue albuterol nebulizer as needed Follow up with this office or with primary care if symptoms are persisting. Follow up in the ER for high fever, trouble swallowing, trouble breathing, other concerning symptoms.   Final Clinical Impressions(s) / UC Diagnoses   Final diagnoses:  URI with cough and congestion  Wheezing      Discharge Instructions     I have sent in doxycycline for you to take one tablet twice a day for 10 days  I have sent in a prednisone taper for you to take for 6 days. 6 tablets on day one, 5 tablets on day two, 4 tablets on day three, 3 tablets on day four, 2 tablets on day five, and 1 tablet on day six.  Continue with nebulizer treatments as needed  Follow up with this office or with primary care if symptoms are persisting.  Follow up in the ER for high fever, trouble swallowing, trouble breathing, other concerning symptoms.     ED Prescriptions    Medication Sig Dispense Auth. Provider   predniSONE (STERAPRED UNI-PAK 21 TAB) 10 MG (21) TBPK tablet Take by mouth daily for 6 days. Take 6 tablets on day 1, 5 tablets on day 2, 4 tablets on day 3, 3 tablets on day 4, 2 tablets on day 5, 1 tablet on day 6 21 tablet Moshe Cipro, NP   doxycycline (VIBRAMYCIN) 100 MG capsule Take 1 capsule (100 mg total) by mouth 2 (two) times daily. 14 capsule Moshe Cipro, NP     PDMP not reviewed this encounter.   Moshe Cipro, NP 09/17/20 1920

## 2020-09-17 NOTE — Discharge Instructions (Addendum)
I have sent in doxycycline for you to take one tablet twice a day for 10 days  I have sent in a prednisone taper for you to take for 6 days. 6 tablets on day one, 5 tablets on day two, 4 tablets on day three, 3 tablets on day four, 2 tablets on day five, and 1 tablet on day six.  Continue with nebulizer treatments as needed  Follow up with this office or with primary care if symptoms are persisting.  Follow up in the ER for high fever, trouble swallowing, trouble breathing, other concerning symptoms.

## 2020-09-17 NOTE — ED Triage Notes (Addendum)
Allergy s/s that started x 3 weeks ago runny nose and cough. Sore throat, ear pain  that started Thursday and coughing up mucous.

## 2020-10-25 ENCOUNTER — Telehealth: Payer: Self-pay

## 2020-10-25 NOTE — Telephone Encounter (Signed)
Unable to reach pt regarding referral. Closed referral

## 2020-10-28 ENCOUNTER — Encounter: Payer: Self-pay | Admitting: Emergency Medicine

## 2020-10-28 ENCOUNTER — Ambulatory Visit
Admission: EM | Admit: 2020-10-28 | Discharge: 2020-10-28 | Disposition: A | Discharge status: Home / Self Care | Payer: Medicaid Other | Attending: Family Medicine | Admitting: Family Medicine

## 2020-10-28 ENCOUNTER — Other Ambulatory Visit: Payer: Self-pay

## 2020-10-28 DIAGNOSIS — H6592 Unspecified nonsuppurative otitis media, left ear: Secondary | ICD-10-CM

## 2020-10-28 DIAGNOSIS — I889 Nonspecific lymphadenitis, unspecified: Secondary | ICD-10-CM | POA: Diagnosis not present

## 2020-10-28 DIAGNOSIS — H9202 Otalgia, left ear: Secondary | ICD-10-CM

## 2020-10-28 MED ORDER — AZITHROMYCIN 250 MG PO TABS: ORAL_TABLET | ORAL | 0 refills | Status: DC

## 2020-10-28 MED ORDER — PREDNISONE 20 MG PO TABS: 20.0000 mg | ORAL_TABLET | Freq: Every day | ORAL | 0 refills | Status: AC

## 2020-10-28 NOTE — Discharge Instructions (Signed)
Start prednisone 20 mg once daily for facial swelling.  Treating you with azithromycin for what I suspect is an evolving ear infection along with adenitis which is inflammation of the lymph nodes surrounding your ear canal.  Tylenol and ibuprofen as needed for pain.  Warm compresses for pain management.

## 2020-10-28 NOTE — ED Provider Notes (Signed)
[facility]    CSN: [id number] Arrival date & time: 10/28/20  0851      History   Chief Complaint Chief Complaint  Patient presents with   Otalgia    HPI Katelyn Adkins is a 20 y.o. female.   HPI Patient presents today with left-sided facial pain and facial swelling.  She reports there is pressure within the left inner ear which is causing severe pain.  Pain with laying on the left face.  She reports symptoms developed overnight. Denies any associated URI symptoms of congestion, throat pain, or cough.  No fever.  Past Medical History:  Diagnosis Date   Asthma    Diabetes mellitus without complication (HCC)    Fatty liver    Hypertension    Polycystic ovarian disease     There are no problems to display for this patient.   Past Surgical History:  Procedure Laterality Date   WISDOM TOOTH EXTRACTION      OB History   No obstetric history on file.      Home Medications    Prior to Admission medications   Medication Sig Start Date End Date Taking? Authorizing Provider  cyclobenzaprine (FLEXERIL) 10 MG tablet Take 1 tablet (10 mg total) by mouth 2 (two) times daily as needed for muscle spasms. 03/07/20   Avegno, Zachery Dakins, FNP  doxycycline (VIBRAMYCIN) 100 MG capsule Take 1 capsule (100 mg total) by mouth 2 (two) times daily. 09/17/20   Moshe Cipro, NP  ondansetron (ZOFRAN-ODT) 4 MG disintegrating tablet Take 1 tablet (4 mg total) by mouth every 8 (eight) hours as needed for nausea or vomiting. 05/04/20   Georgetta Haber, NP    Family History Family History  Problem Relation Age of Onset   Diabetes Mother    Healthy Father     Social History Social History   Tobacco Use   Smoking status: Former    Pack years: 0.00   Smokeless tobacco: Former    Types: Chew  Substance Use Topics   Alcohol use: No   Drug use: No     Allergies   Penicillins and Tylenol [acetaminophen]   Review of Systems Review of Systems Pertinent  negatives listed in HPI    Physical Exam Triage Vital Signs ED Triage Vitals  Enc Vitals Group     BP 10/28/20 0914 (!) 158/97     Pulse Rate 10/28/20 0914 95     Resp 10/28/20 0914 (!) 21     Temp 10/28/20 0914 98 F (36.7 C)     Temp Source 10/28/20 0914 Oral     SpO2 10/28/20 0914 94 %     Weight --      Height --      Head Circumference --      Peak Flow --      Pain Score 10/28/20 0912 4     Pain Loc --      Pain Edu? --      Excl. in GC? --    No data found.  Updated Vital Signs BP (!) 158/97 (BP Location: Right Wrist)   Pulse 95   Temp 98 F (36.7 C) (Oral)   Resp (!) 21   SpO2 94%   Visual Acuity Right Eye Distance:   Left Eye Distance:   Bilateral Distance:    Right Eye Near:   Left Eye Near:    Bilateral Near:     Physical Exam HENT:     Left Ear: Swelling  and tenderness present. A middle ear effusion is present.     Ears:     Comments: Preauricular adenopathy and tenderness Swelling extending into the left maxillary region Cardiovascular:     Rate and Rhythm: Normal rate and regular rhythm.  Pulmonary:     Effort: Pulmonary effort is normal.     Breath sounds: Normal breath sounds.  Neurological:     Mental Status: She is alert.  Psychiatric:        Attention and Perception: Attention and perception normal.        Mood and Affect: Mood normal.        Speech: Speech normal.     UC Treatments / Results  Labs (all labs ordered are listed, but only abnormal results are displayed) Labs Reviewed - No data to display  EKG   Radiology No results found.  Procedures Procedures (including critical care time)  Medications Ordered in UC Medications - No data to display  Initial Impression / Assessment and Plan / UC Course  I have reviewed the triage vital signs and the nursing notes.  Pertinent labs & imaging results that were available during my care of the patient were reviewed by me and considered in my medical decision making (see  chart for details).    Patient with acute onset of preauricular lymphadenitis causing facial swelling and middle ear effusion with otalgia.  Treating presumptively for an evolving ear infection with azithromycin.  Also given the degree of facial swelling treating with prednisone 20 mg once daily.  Encourage warm compresses.  Ibuprofen or Tylenol for pain as needed.  Return precautions given Final Clinical Impressions(s) / UC Diagnoses   Final diagnoses:  Preauricular lymphadenitis  MEE (middle ear effusion), left  Otalgia, left     Discharge Instructions      Start prednisone 20 mg once daily for facial swelling.  Treating you with azithromycin for what I suspect is an evolving ear infection along with adenitis which is inflammation of the lymph nodes surrounding your ear canal.  Tylenol and ibuprofen as needed for pain.  Warm compresses for pain management.    ED Prescriptions     Medication Sig Dispense Auth. Provider   azithromycin (ZITHROMAX) 250 MG tablet Take 2 tabs PO x 1 dose, then 1 tab PO QD x 4 days 6 tablet Bing Neighbors, FNP   predniSONE (DELTASONE) 20 MG tablet Take 1 tablet (20 mg total) by mouth daily with breakfast for 5 days. 5 tablet Bing Neighbors, FNP      PDMP not reviewed this encounter.   Bing Neighbors, FNP 10/28/20 1004

## 2020-10-28 NOTE — ED Triage Notes (Signed)
LT ear pain that radiates down her neck since waking up this morning.

## 2021-01-15 DIAGNOSIS — I10 Essential (primary) hypertension: Secondary | ICD-10-CM | POA: Insufficient documentation

## 2021-01-15 DIAGNOSIS — J45909 Unspecified asthma, uncomplicated: Secondary | ICD-10-CM | POA: Insufficient documentation

## 2021-01-15 DIAGNOSIS — K760 Fatty (change of) liver, not elsewhere classified: Secondary | ICD-10-CM | POA: Insufficient documentation

## 2021-01-15 DIAGNOSIS — E282 Polycystic ovarian syndrome: Secondary | ICD-10-CM | POA: Insufficient documentation

## 2021-01-15 DIAGNOSIS — E119 Type 2 diabetes mellitus without complications: Secondary | ICD-10-CM | POA: Insufficient documentation

## 2021-04-15 ENCOUNTER — Other Ambulatory Visit: Payer: Self-pay

## 2021-04-15 ENCOUNTER — Ambulatory Visit: Payer: Medicaid Other

## 2021-04-15 NOTE — Progress Notes (Unsigned)
Pt did not show for scheduled appointment.  

## 2021-05-07 IMAGING — CT CT ELBOW*R* W/O CM
3 of 5 series · 11 of 33 positions shown, 13 images · non-contrast
Comparison: None.

CLINICAL DATA: Elbow pain since falling 5 days ago. Evaluate for
fracture.

EXAM:
CT OF THE UPPER RIGHT EXTREMITY WITHOUT CONTRAST
TECHNIQUE: Multidetector CT imaging of the upper right elbow was performed
according to the standard protocol.

[Series 9: sag bone · coronal · 0.21mm/px · 2 of 60 slices shown]
[im 28/60  bone]
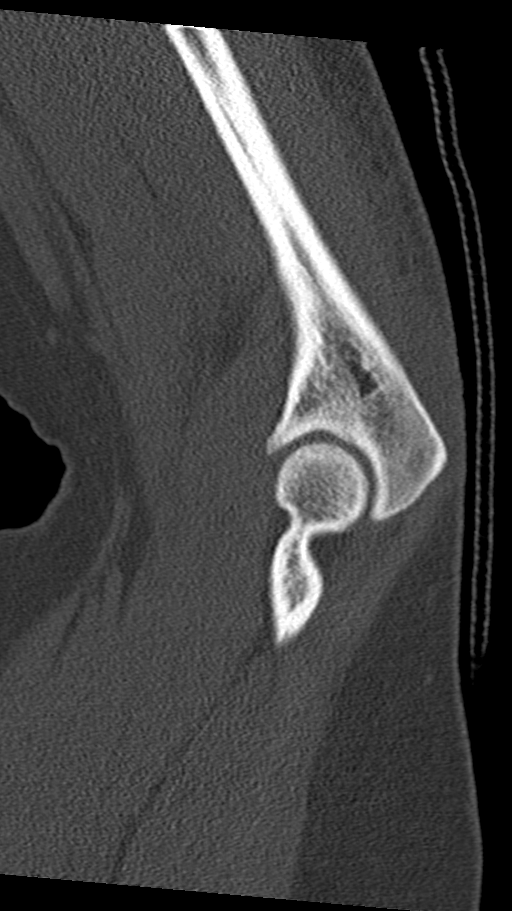
[im 56/60  bone]
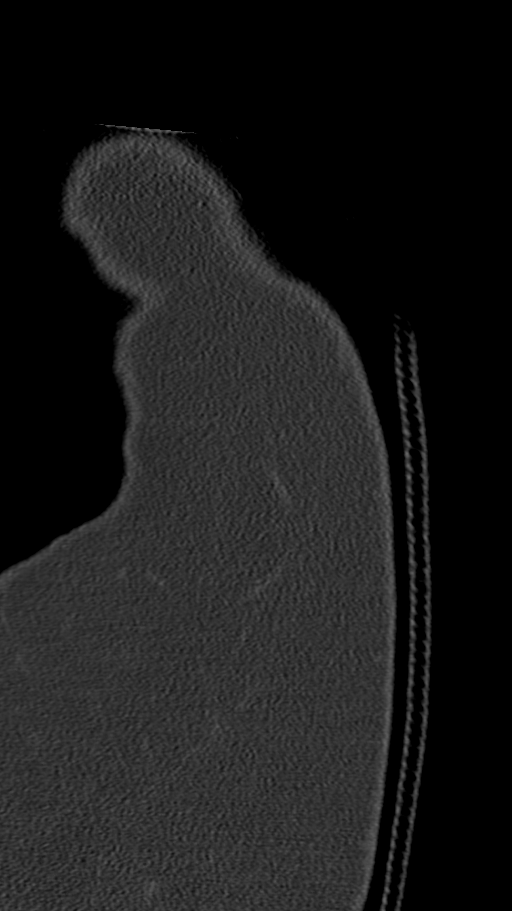

[Series 10: ax st · axial · 0.21mm/px · z∈[+394,+522]mm · 4 of 99 slices shown, 5 images]
[im 15/99  soft-tissue]
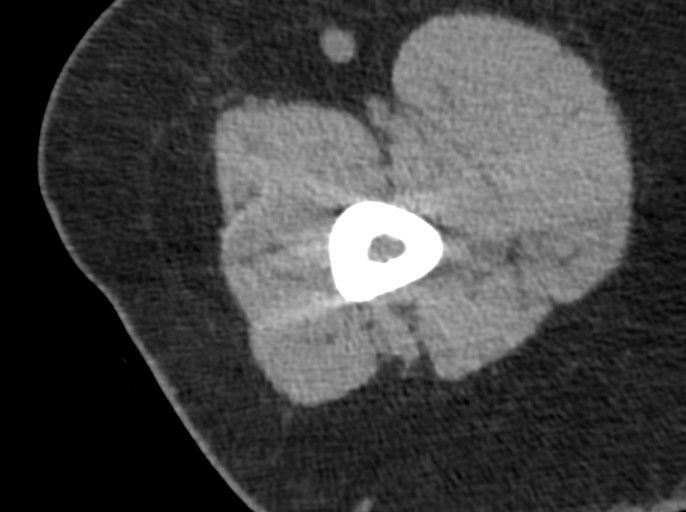
[im 15/99  bone]
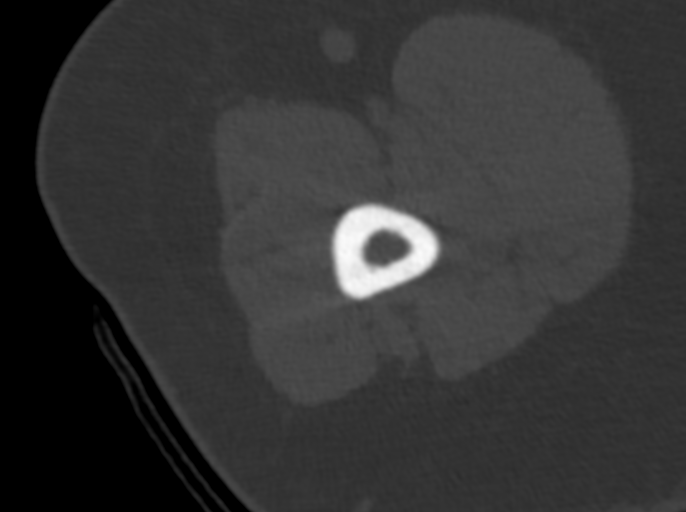
[im 43/99  bone]
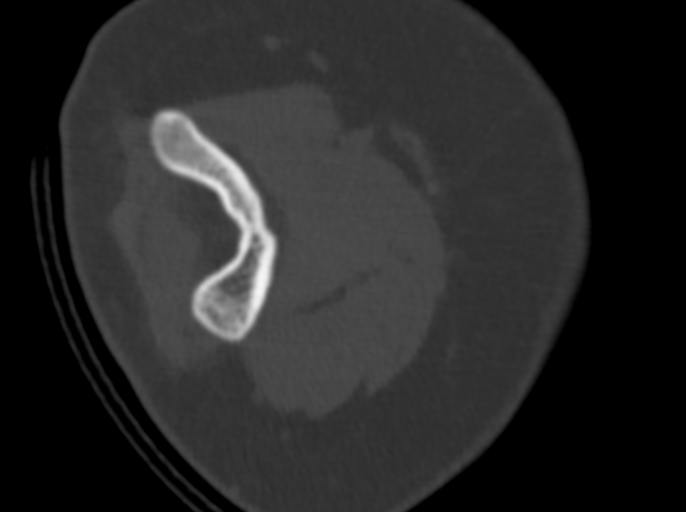
[im 57/99  bone]
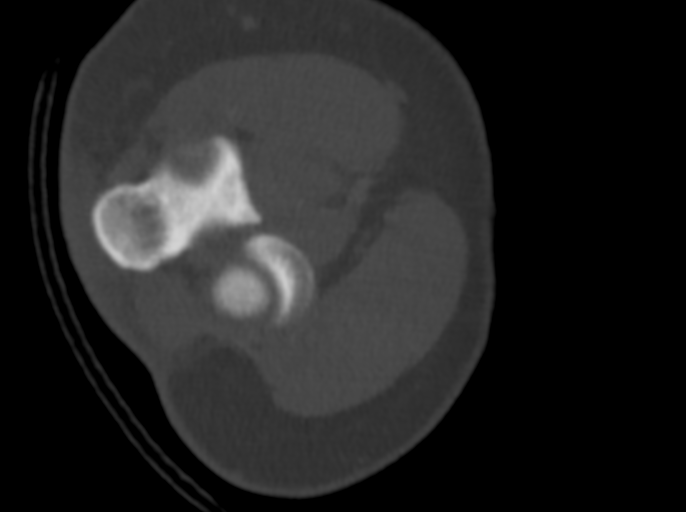
[im 85/99  bone]
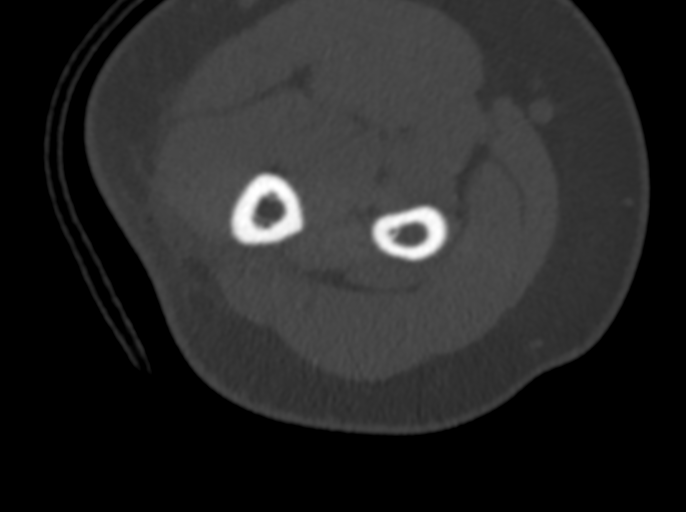

[Series 11: cor st · sagittal · 0.28mm/px · 5 of 62 slices shown, 6 images]
[im 21/62  bone]
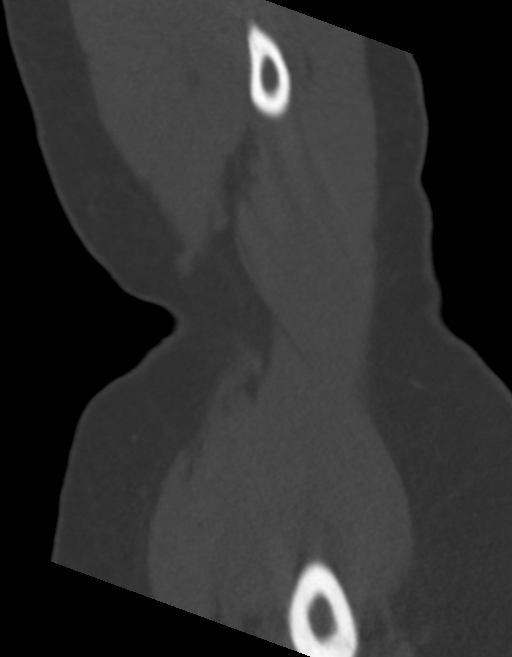
[im 26/62  bone]
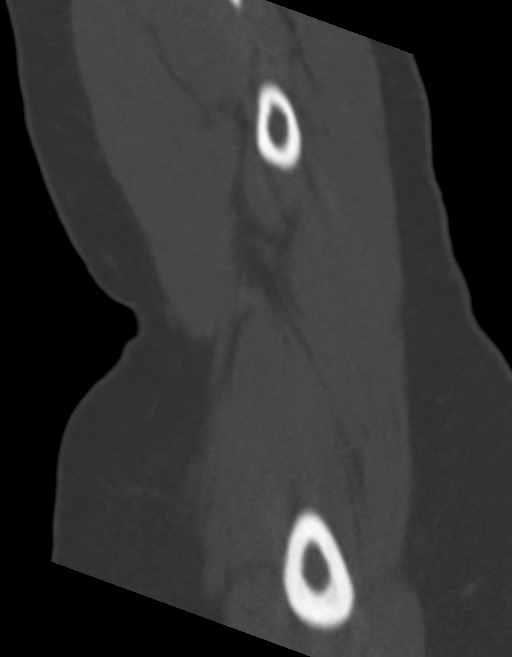
[im 31/62  soft-tissue]
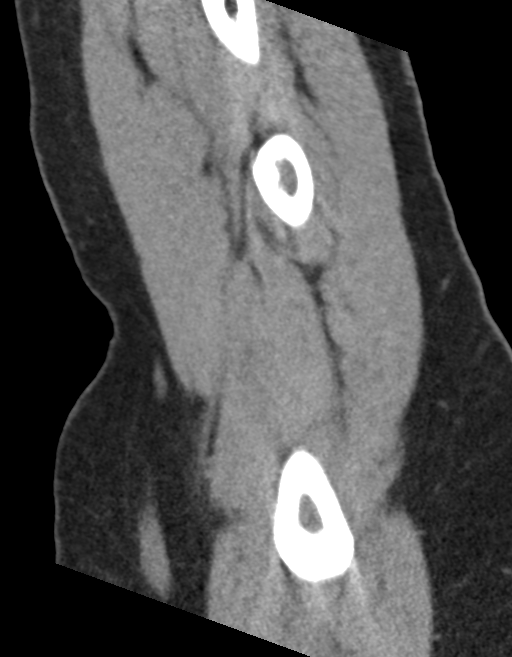
[im 31/62  bone]
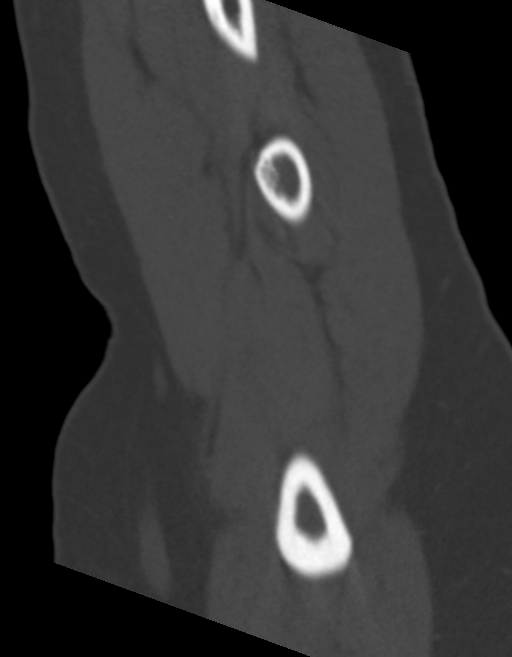
[im 36/62  bone]
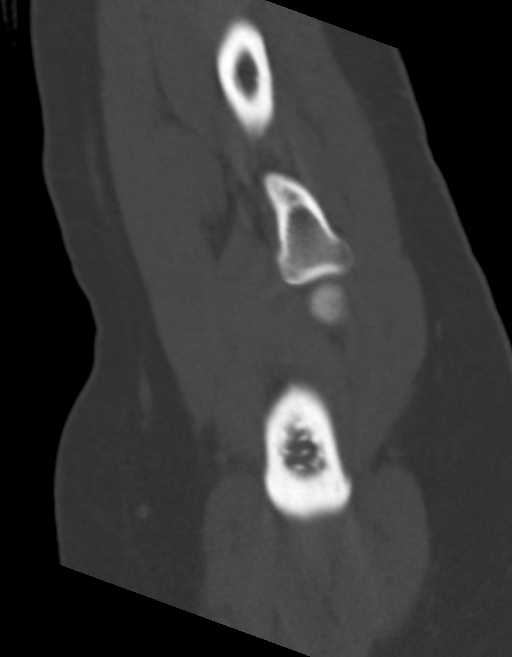
[im 41/62  bone]
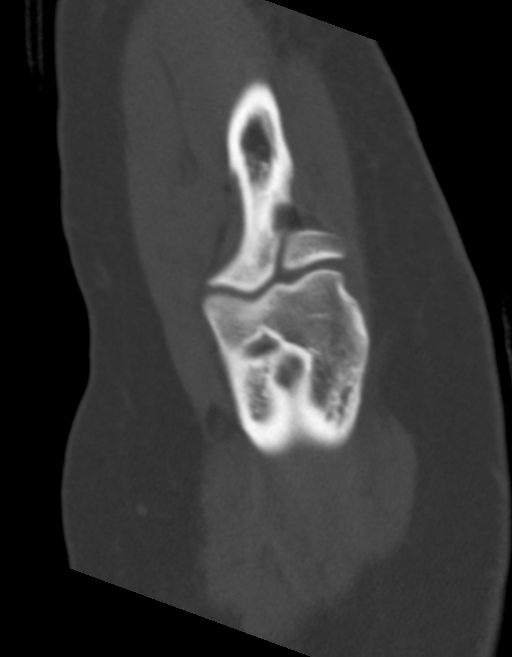

[11 of 33 positions shown; findings below may reference images not displayed]

FINDINGS: Bones/Joint/Cartilage

There is no evidence of acute fracture or dislocation. The joint
spaces are preserved. There is no elbow joint effusion.

Ligaments

Suboptimally assessed by CT.

Muscles and Tendons

The elbow tendons appear intact. No focal muscular abnormalities
identified.

Soft tissues

Subcutaneous soft tissue swelling dorsal to the olecranon process.
No focal fluid collection or foreign body identified.
IMPRESSION: 1. No evidence of acute fracture, dislocation or elbow joint
effusion.
2. Subcutaneous soft tissue swelling dorsal to the olecranon
process.

## 2021-06-04 ENCOUNTER — Ambulatory Visit: Payer: Medicaid Other | Admitting: Internal Medicine

## 2021-06-04 ENCOUNTER — Telehealth: Payer: Medicaid Other | Admitting: Internal Medicine

## 2022-05-14 DIAGNOSIS — M79644 Pain in right finger(s): Secondary | ICD-10-CM | POA: Insufficient documentation

## 2022-06-29 ENCOUNTER — Other Ambulatory Visit: Payer: Self-pay

## 2022-06-29 ENCOUNTER — Emergency Department (HOSPITAL_COMMUNITY)
Admission: EM | Admit: 2022-06-29 | Discharge: 2022-06-29 | Disposition: A | Discharge status: Home / Self Care | Payer: Medicaid Other | Attending: Emergency Medicine | Admitting: Emergency Medicine

## 2022-06-29 ENCOUNTER — Encounter (HOSPITAL_COMMUNITY): Payer: Self-pay

## 2022-06-29 DIAGNOSIS — Z1152 Encounter for screening for COVID-19: Secondary | ICD-10-CM | POA: Diagnosis not present

## 2022-06-29 DIAGNOSIS — R739 Hyperglycemia, unspecified: Secondary | ICD-10-CM

## 2022-06-29 DIAGNOSIS — J45909 Unspecified asthma, uncomplicated: Secondary | ICD-10-CM | POA: Diagnosis not present

## 2022-06-29 DIAGNOSIS — E1165 Type 2 diabetes mellitus with hyperglycemia: Secondary | ICD-10-CM | POA: Insufficient documentation

## 2022-06-29 DIAGNOSIS — J069 Acute upper respiratory infection, unspecified: Secondary | ICD-10-CM | POA: Diagnosis not present

## 2022-06-29 DIAGNOSIS — R059 Cough, unspecified: Secondary | ICD-10-CM | POA: Diagnosis present

## 2022-06-29 LAB — RESP PANEL BY RT-PCR (RSV, FLU A&B, COVID)  RVPGX2
Influenza A by PCR: NEGATIVE
Influenza B by PCR: NEGATIVE
Resp Syncytial Virus by PCR: NEGATIVE
SARS Coronavirus 2 by RT PCR: NEGATIVE

## 2022-06-29 LAB — CBG MONITORING, ED: Glucose-Capillary: 267 mg/dL — ABNORMAL HIGH (ref 70–99)

## 2022-06-29 LAB — GROUP A STREP BY PCR: Group A Strep by PCR: NOT DETECTED

## 2022-06-29 NOTE — ED Triage Notes (Signed)
Pt states she was diagnosed with the flu a week ago and symptoms continue. She has now developed a sore throat and has lost her voice. Pt states shortness of breath has gotten worse.  Denies fevers at this time. Pt A&Ox4, ambulatory, NAD noted.

## 2022-06-29 NOTE — ED Provider Notes (Signed)
[address]  Provider Note  CSN: [id number] Arrival date & time: 06/29/22 0319  History Chief Complaint  Patient presents with   Sore Throat    Katelyn Adkins is a 22 y.o. female with history of DM, PCOS and asthma reports 2 weeks of viral URI symptoms. Tested positive for Flu B at PCP office but we out of the treatment window for Tamiflu. She has been having continued cough, sore throat and difficulty breathing with loss of voice.    Home Medications Prior to Admission medications   Medication Sig Start Date End Date Taking? Authorizing Provider  albuterol (PROAIR HFA) 108 (90 Base) MCG/ACT inhaler  04/12/10  Yes [provider]  Dulaglutide (TRULICITY) A999333 [id number] SOPN Inject into the skin. 01/16/21  Yes [provider]  guaiFENesin (ROBITUSSIN) 100 MG/5ML liquid 10 mL as needed for cough Orally every 8 hrs for 7 days 06/17/22  Yes [provider]  ipratropium-albuterol (DUONEB) 0.5-2.5 (3) MG/3ML SOLN 3 mL as needed Inhalation every 6 hrs for 30 days 02/06/20  Yes [provider]  promethazine-dextromethorphan (PROMETHAZINE-DM) 6.25-15 MG/5ML syrup 5 mL as needed Orally every 6 hrs for 10 days 03/19/22  Yes [provider]  cyclobenzaprine (FLEXERIL) 10 MG tablet Take 1 tablet (10 mg total) by mouth 2 (two) times daily as needed for muscle spasms. 03/07/20   Avegno, Darrelyn Hillock, FNP  ibuprofen (ADVIL) 400 MG tablet ibuprofen 400 mg tablet    [provider]  meloxicam (MOBIC) 7.5 MG tablet 1 tablet as needed for pain Orally twice a day for 30 day(s)    [provider]  methocarbamol (ROBAXIN) 500 MG tablet methocarbamol 500 mg tablet    [provider]     Allergies    Penicillins, Tylenol [acetaminophen], and Other   Review of Systems   Review of Systems Please see HPI for pertinent positives and negatives  Physical Exam BP (!) 140/93 (BP Location: Left Arm)    Pulse 96   Temp 98.1 F (36.7 C) (Oral)   Resp (!) 22   Ht 5' 4"$  (1.626 m)   Wt (!) 186 kg   LMP 06/29/2022 (Exact Date)   SpO2 95%   BMI 70.38 kg/m   Physical Exam Vitals and nursing note reviewed.  Constitutional:      Appearance: Normal appearance. She is obese.  HENT:     Head: Normocephalic and atraumatic.     Nose: Nose normal.     Mouth/Throat:     Mouth: Mucous membranes are moist.     Pharynx: No oropharyngeal exudate or posterior oropharyngeal erythema.     Tonsils: No tonsillar exudate or tonsillar abscesses. 2+ on the right. 1+ on the left.  Eyes:     Extraocular Movements: Extraocular movements intact.     Conjunctiva/sclera: Conjunctivae normal.  Cardiovascular:     Rate and Rhythm: Normal rate.  Pulmonary:     Effort: Pulmonary effort is normal.     Breath sounds: Normal breath sounds. No wheezing.  Abdominal:     General: Abdomen is flat.     Palpations: Abdomen is soft.     Tenderness: There is no abdominal tenderness.  Musculoskeletal:        General: No swelling. Normal range of motion.     Cervical back: Neck supple.  Skin:    General: Skin is warm and dry.  Neurological:     General: No focal deficit present.  Mental Status: She is alert.  Psychiatric:        Mood and Affect: Mood normal.     ED Results / Procedures / Treatments   EKG None  Procedures Procedures  Medications Ordered in the ED Medications - No data to display  Initial Impression and Plan  Patient well appearing with reassuring vitals, here with persistent URI symptoms for 2 weeks. Exam is benign. Covid/Flu/RSV swab and Strep swab pending.   ED Course   Clinical Course as of 06/29/22 0441  Sun Jun 29, 2022  [id number] Strep is neg.  [CS]  0426 Covid/Flu/RSV swab is neg. Glucose is elevated. She admits to drinking regular sodas and not checking her glucose at home. Given her poorly controlled DM, steroids likely to cause more harm than benefit. Advised to continue with  symptomatic care at home. Monitor glucose daily and follow up with PCP. RTED for any other concerns.  [CS]    Clinical Course User Index [CS] Truddie Hidden, MD     MDM Rules/Calculators/A&P Medical Decision Making Problems Addressed: Hyperglycemia: chronic illness or injury Viral URI with cough: acute illness or injury  Amount and/or Complexity of Data Reviewed Labs: ordered. Decision-making details documented in ED Course.  Risk OTC drugs.     Final Clinical Impression(s) / ED Diagnoses Final diagnoses:  Viral URI with cough  Hyperglycemia    Rx / DC Orders ED Discharge Orders     None        Truddie Hidden, MD 06/29/22 [phone]

## 2023-06-11 ENCOUNTER — Encounter (HOSPITAL_COMMUNITY): Payer: Self-pay | Admitting: Family Medicine

## 2023-06-15 ENCOUNTER — Other Ambulatory Visit (HOSPITAL_COMMUNITY): Payer: Self-pay | Admitting: Family Medicine

## 2023-06-15 DIAGNOSIS — R1084 Generalized abdominal pain: Secondary | ICD-10-CM

## 2023-06-22 ENCOUNTER — Ambulatory Visit (HOSPITAL_COMMUNITY)
Admission: RE | Admit: 2023-06-22 | Discharge: 2023-06-22 | Disposition: A | Discharge status: Home / Self Care | Payer: Medicaid Other | Source: Ambulatory Visit | Attending: Family Medicine | Admitting: Family Medicine

## 2023-06-22 DIAGNOSIS — R1084 Generalized abdominal pain: Secondary | ICD-10-CM | POA: Insufficient documentation
